# Patient Record
Sex: Male | Born: 1935 | Race: White | Hispanic: No | Marital: Married | Smoking: Former smoker
Health system: Southern US, Community
[De-identification: ages and names within clinical notes are randomized; demographics above are authoritative.]

## PROBLEM LIST (undated history)

## (undated) DIAGNOSIS — R0902 Hypoxemia: Secondary | ICD-10-CM

## (undated) DIAGNOSIS — R269 Unspecified abnormalities of gait and mobility: Secondary | ICD-10-CM

## (undated) DIAGNOSIS — R351 Nocturia: Secondary | ICD-10-CM

## (undated) DIAGNOSIS — H0230 Blepharochalasis unspecified eye, unspecified eyelid: Secondary | ICD-10-CM

## (undated) DIAGNOSIS — I10 Essential (primary) hypertension: Secondary | ICD-10-CM

## (undated) DIAGNOSIS — Z9181 History of falling: Secondary | ICD-10-CM

## (undated) DIAGNOSIS — R0602 Shortness of breath: Secondary | ICD-10-CM

## (undated) DIAGNOSIS — D485 Neoplasm of uncertain behavior of skin: Secondary | ICD-10-CM

## (undated) DIAGNOSIS — M2550 Pain in unspecified joint: Secondary | ICD-10-CM

## (undated) DIAGNOSIS — M47816 Spondylosis without myelopathy or radiculopathy, lumbar region: Secondary | ICD-10-CM

## (undated) DIAGNOSIS — M2560 Stiffness of unspecified joint, not elsewhere classified: Secondary | ICD-10-CM

## (undated) DIAGNOSIS — R7989 Other specified abnormal findings of blood chemistry: Secondary | ICD-10-CM

## (undated) DIAGNOSIS — N400 Enlarged prostate without lower urinary tract symptoms: Secondary | ICD-10-CM

## (undated) DIAGNOSIS — L821 Other seborrheic keratosis: Secondary | ICD-10-CM

## (undated) DIAGNOSIS — R32 Unspecified urinary incontinence: Secondary | ICD-10-CM

## (undated) DIAGNOSIS — D469 Myelodysplastic syndrome, unspecified: Secondary | ICD-10-CM

## (undated) DIAGNOSIS — D649 Anemia, unspecified: Secondary | ICD-10-CM

## (undated) DIAGNOSIS — E039 Hypothyroidism, unspecified: Secondary | ICD-10-CM

## (undated) DIAGNOSIS — Z8719 Personal history of other diseases of the digestive system: Secondary | ICD-10-CM

## (undated) DIAGNOSIS — D510 Vitamin B12 deficiency anemia due to intrinsic factor deficiency: Secondary | ICD-10-CM

## (undated) DIAGNOSIS — M1990 Unspecified osteoarthritis, unspecified site: Secondary | ICD-10-CM

## (undated) DIAGNOSIS — M25569 Pain in unspecified knee: Secondary | ICD-10-CM

## (undated) DIAGNOSIS — C44201 Unspecified malignant neoplasm of skin of unspecified ear and external auricular canal: Secondary | ICD-10-CM

## (undated) DIAGNOSIS — R5383 Other fatigue: Secondary | ICD-10-CM

## (undated) DIAGNOSIS — R5381 Other malaise: Secondary | ICD-10-CM

## (undated) DIAGNOSIS — R39198 Other difficulties with micturition: Secondary | ICD-10-CM

## (undated) DIAGNOSIS — Z9889 Other specified postprocedural states: Secondary | ICD-10-CM

## (undated) DIAGNOSIS — R1310 Dysphagia, unspecified: Secondary | ICD-10-CM

## (undated) HISTORY — DX: Spondylosis without myelopathy or radiculopathy, lumbar region: M47.816

## (undated) HISTORY — DX: Nocturia: R35.1

## (undated) HISTORY — DX: Shortness of breath: R06.02

## (undated) HISTORY — DX: Other malaise: R53.81

## (undated) HISTORY — DX: Benign prostatic hyperplasia without lower urinary tract symptoms: N40.0

## (undated) HISTORY — DX: Personal history of other diseases of the digestive system: Z87.19

## (undated) HISTORY — DX: Pain in unspecified joint: M25.50

## (undated) HISTORY — DX: Other fatigue: R53.83

## (undated) HISTORY — DX: History of falling: Z91.81

## (undated) HISTORY — DX: Vitamin B12 deficiency anemia due to intrinsic factor deficiency: D51.0

## (undated) HISTORY — DX: Dysphagia, unspecified: R13.10

## (undated) HISTORY — DX: Anemia, unspecified: D64.9

## (undated) HISTORY — DX: Neoplasm of uncertain behavior of skin: D48.5

## (undated) HISTORY — DX: Essential (primary) hypertension: I10

## (undated) HISTORY — DX: Unspecified urinary incontinence: R32

## (undated) HISTORY — DX: Other seborrheic keratosis: L82.1

## (undated) HISTORY — PX: TONSILLECTOMY: SUR1361

## (undated) HISTORY — DX: Hypothyroidism, unspecified: E03.9

## (undated) HISTORY — DX: Other difficulties with micturition: R39.198

## (undated) HISTORY — DX: Hypoxemia: R09.02

## (undated) HISTORY — DX: Pain in unspecified knee: M25.569

## (undated) HISTORY — DX: Stiffness of unspecified joint, not elsewhere classified: M25.60

## (undated) HISTORY — PX: CHOLECYSTECTOMY: SHX55

## (undated) HISTORY — DX: Unspecified osteoarthritis, unspecified site: M19.90

## (undated) HISTORY — DX: Myelodysplastic syndrome, unspecified: D46.9

## (undated) HISTORY — DX: Other specified postprocedural states: Z98.890

## (undated) HISTORY — DX: Unspecified malignant neoplasm of skin of unspecified ear and external auricular canal: C44.201

## (undated) HISTORY — DX: Blepharochalasis unspecified eye, unspecified eyelid: H02.30

## (undated) HISTORY — DX: Unspecified abnormalities of gait and mobility: R26.9

## (undated) HISTORY — DX: Other specified abnormal findings of blood chemistry: R79.89

---

## 1978-11-21 HISTORY — PX: REPAIR ZENKER'S DIVERTICULA: SUR1212

## 2006-10-25 ENCOUNTER — Encounter (HOSPITAL_COMMUNITY): Admission: RE | Admit: 2006-10-25 | Discharge: 2006-12-19 | Payer: Self-pay | Admitting: Internal Medicine

## 2007-02-02 ENCOUNTER — Encounter (HOSPITAL_COMMUNITY): Admission: RE | Admit: 2007-02-02 | Discharge: 2007-03-22 | Payer: Self-pay | Admitting: Internal Medicine

## 2007-10-24 ENCOUNTER — Ambulatory Visit: Payer: Self-pay | Admitting: Gastroenterology

## 2007-10-24 DIAGNOSIS — K5900 Constipation, unspecified: Secondary | ICD-10-CM | POA: Insufficient documentation

## 2007-10-24 DIAGNOSIS — K644 Residual hemorrhoidal skin tags: Secondary | ICD-10-CM | POA: Insufficient documentation

## 2007-10-25 ENCOUNTER — Ambulatory Visit: Payer: Self-pay | Admitting: Gastroenterology

## 2007-10-25 LAB — CONVERTED CEMR LAB
Basophils Relative: 0.1 % (ref 0.0–3.0)
Eosinophils Absolute: 0 10*3/uL (ref 0.0–0.7)
Eosinophils Relative: 0.7 % (ref 0.0–5.0)
HCT: 27.4 % — ABNORMAL LOW (ref 39.0–52.0)
Hemoglobin: 9.2 g/dL — ABNORMAL LOW (ref 13.0–17.0)
MCV: 100.8 fL — ABNORMAL HIGH (ref 78.0–100.0)
Monocytes Absolute: 0.1 10*3/uL (ref 0.1–1.0)
Monocytes Relative: 3.2 % (ref 3.0–12.0)
Neutro Abs: 1.4 10*3/uL (ref 1.4–7.7)
Platelets: 92 10*3/uL — ABNORMAL LOW (ref 150–400)
WBC: 4.2 10*3/uL — ABNORMAL LOW (ref 4.5–10.5)

## 2007-10-26 ENCOUNTER — Encounter: Payer: Self-pay | Admitting: Gastroenterology

## 2007-10-26 LAB — CONVERTED CEMR LAB
AST: 37 units/L (ref 0–37)
Albumin: 3.3 g/dL — ABNORMAL LOW (ref 3.5–5.2)
Alkaline Phosphatase: 81 units/L (ref 39–117)
BUN: 21 mg/dL (ref 6–23)
Basophils Absolute: 0 10*3/uL (ref 0.0–0.1)
Basophils Relative: 0 % (ref 0.0–3.0)
Creatinine, Ser: 1.2 mg/dL (ref 0.4–1.5)
GFR calc Af Amer: 73 mL/min
GFR calc non Af Amer: 60 mL/min
Glucose, Bld: 115 mg/dL — ABNORMAL HIGH (ref 70–99)
HCT: 27.7 % — ABNORMAL LOW (ref 39.0–52.0)
Hemoglobin: 9 g/dL — ABNORMAL LOW (ref 13.0–17.0)
Iron: 201 ug/dL — ABNORMAL HIGH (ref 42–165)
MCHC: 32.7 g/dL (ref 30.0–36.0)
MCV: 101.6 fL — ABNORMAL HIGH (ref 78.0–100.0)
Monocytes Absolute: 0.1 10*3/uL (ref 0.1–1.0)
Neutro Abs: 1 10*3/uL — ABNORMAL LOW (ref 1.4–7.7)
RBC: 2.72 M/uL — ABNORMAL LOW (ref 4.22–5.81)
RDW: 20.5 % — ABNORMAL HIGH (ref 11.5–14.6)
Saturation Ratios: 87.5 % — ABNORMAL HIGH (ref 20.0–50.0)
Total Protein: 7.5 g/dL (ref 6.0–8.3)
Vitamin B-12: 676 pg/mL (ref 211–911)
aPTT: 29.2 s (ref 21.7–29.8)

## 2007-10-27 ENCOUNTER — Ambulatory Visit (HOSPITAL_COMMUNITY): Admission: RE | Admit: 2007-10-27 | Discharge: 2007-10-27 | Payer: Self-pay | Admitting: Gastroenterology

## 2007-11-14 ENCOUNTER — Ambulatory Visit: Payer: Self-pay | Admitting: Gastroenterology

## 2009-02-28 ENCOUNTER — Telehealth: Payer: Self-pay | Admitting: Gastroenterology

## 2010-05-19 ENCOUNTER — Other Ambulatory Visit (HOSPITAL_COMMUNITY): Payer: Self-pay | Admitting: Internal Medicine

## 2010-05-26 ENCOUNTER — Other Ambulatory Visit (HOSPITAL_COMMUNITY): Payer: Self-pay | Admitting: *Deleted

## 2010-05-27 ENCOUNTER — Other Ambulatory Visit (HOSPITAL_COMMUNITY): Payer: Self-pay

## 2010-05-27 ENCOUNTER — Encounter (HOSPITAL_COMMUNITY): Payer: Self-pay

## 2010-05-30 ENCOUNTER — Emergency Department (HOSPITAL_COMMUNITY): Payer: Medicare Other

## 2010-05-30 ENCOUNTER — Inpatient Hospital Stay (HOSPITAL_COMMUNITY)
Admission: EM | Admit: 2010-05-30 | Discharge: 2010-06-05 | DRG: 377 | Disposition: A | Discharge status: Home / Self Care | Payer: Medicare Other | Attending: Internal Medicine | Admitting: Internal Medicine

## 2010-05-30 DIAGNOSIS — E039 Hypothyroidism, unspecified: Secondary | ICD-10-CM | POA: Diagnosis present

## 2010-05-30 DIAGNOSIS — D62 Acute posthemorrhagic anemia: Secondary | ICD-10-CM | POA: Diagnosis present

## 2010-05-30 DIAGNOSIS — D638 Anemia in other chronic diseases classified elsewhere: Secondary | ICD-10-CM | POA: Diagnosis present

## 2010-05-30 DIAGNOSIS — I509 Heart failure, unspecified: Secondary | ICD-10-CM | POA: Diagnosis present

## 2010-05-30 DIAGNOSIS — N179 Acute kidney failure, unspecified: Secondary | ICD-10-CM | POA: Diagnosis present

## 2010-05-30 DIAGNOSIS — N400 Enlarged prostate without lower urinary tract symptoms: Secondary | ICD-10-CM | POA: Diagnosis present

## 2010-05-30 DIAGNOSIS — Z66 Do not resuscitate: Secondary | ICD-10-CM | POA: Diagnosis present

## 2010-05-30 DIAGNOSIS — K922 Gastrointestinal hemorrhage, unspecified: Principal | ICD-10-CM | POA: Diagnosis present

## 2010-05-30 DIAGNOSIS — D61818 Other pancytopenia: Secondary | ICD-10-CM | POA: Diagnosis present

## 2010-05-30 DIAGNOSIS — I5031 Acute diastolic (congestive) heart failure: Secondary | ICD-10-CM | POA: Diagnosis present

## 2010-05-30 DIAGNOSIS — I498 Other specified cardiac arrhythmias: Secondary | ICD-10-CM | POA: Diagnosis present

## 2010-05-30 DIAGNOSIS — I214 Non-ST elevation (NSTEMI) myocardial infarction: Secondary | ICD-10-CM | POA: Diagnosis not present

## 2010-05-30 LAB — DIFFERENTIAL
Basophils Relative: 0 % (ref 0–1)
Eosinophils Absolute: 0 10*3/uL (ref 0.0–0.7)
Eosinophils Relative: 0 % (ref 0–5)
Lymphocytes Relative: 25 % (ref 12–46)
Neutro Abs: 2.1 10*3/uL (ref 1.7–7.7)
Neutrophils Relative %: 72 % (ref 43–77)

## 2010-05-30 LAB — BRAIN NATRIURETIC PEPTIDE: Pro B Natriuretic peptide (BNP): 1435 pg/mL — ABNORMAL HIGH (ref 0.0–100.0)

## 2010-05-30 LAB — POCT CARDIAC MARKERS: Troponin i, poc: 0.58 ng/mL (ref 0.00–0.09)

## 2010-05-30 LAB — CBC
Hemoglobin: 5.5 g/dL — CL (ref 13.0–17.0)
MCH: 30.6 pg (ref 26.0–34.0)
MCHC: 31.8 g/dL (ref 30.0–36.0)
MCV: 96.1 fL (ref 78.0–100.0)
RBC: 1.8 MIL/uL — ABNORMAL LOW (ref 4.22–5.81)

## 2010-05-30 LAB — BASIC METABOLIC PANEL
Chloride: 107 mEq/L (ref 96–112)
GFR calc non Af Amer: 52 mL/min — ABNORMAL LOW (ref >60)
Glucose, Bld: 159 mg/dL — ABNORMAL HIGH (ref 70–99)
Potassium: 4.4 mEq/L (ref 3.5–5.1)
Sodium: 134 mEq/L — ABNORMAL LOW (ref 135–145)

## 2010-05-30 LAB — OCCULT BLOOD, POC DEVICE: Fecal Occult Bld: POSITIVE

## 2010-05-31 ENCOUNTER — Emergency Department (HOSPITAL_COMMUNITY): Payer: Medicare Other

## 2010-05-31 ENCOUNTER — Inpatient Hospital Stay (HOSPITAL_COMMUNITY): Payer: Medicare Other

## 2010-05-31 DIAGNOSIS — K921 Melena: Secondary | ICD-10-CM

## 2010-05-31 DIAGNOSIS — D62 Acute posthemorrhagic anemia: Secondary | ICD-10-CM

## 2010-05-31 LAB — FOLATE: Folate: 4.1 ng/mL

## 2010-05-31 LAB — CBC
Hemoglobin: 8.2 g/dL — ABNORMAL LOW (ref 13.0–17.0)
MCHC: 31.5 g/dL (ref 30.0–36.0)
MCHC: 33.2 g/dL (ref 30.0–36.0)
Platelets: 88 10*3/uL — ABNORMAL LOW (ref 150–400)
RDW: 21.7 % — ABNORMAL HIGH (ref 11.5–15.5)
RDW: 20.4 % — ABNORMAL HIGH (ref 11.5–15.5)

## 2010-05-31 LAB — COMPREHENSIVE METABOLIC PANEL
ALT: 16 U/L (ref 0–53)
AST: 27 U/L (ref 0–37)
Calcium: 8.6 mg/dL (ref 8.4–10.5)
GFR calc Af Amer: 60 mL/min — ABNORMAL LOW (ref >60)
Sodium: 136 mEq/L (ref 135–145)
Total Protein: 6 g/dL (ref 6.0–8.3)

## 2010-05-31 LAB — URINALYSIS, ROUTINE W REFLEX MICROSCOPIC
Leukocytes, UA: NEGATIVE
Nitrite: NEGATIVE
Specific Gravity, Urine: 1.015 (ref 1.005–1.030)
pH: 5 (ref 5.0–8.0)

## 2010-05-31 LAB — MRSA PCR SCREENING: MRSA by PCR: NEGATIVE

## 2010-05-31 LAB — CARDIAC PANEL(CRET KIN+CKTOT+MB+TROPI)
CK, MB: 17.1 ng/mL (ref 0.3–4.0)
Relative Index: INVALID (ref 0.0–2.5)
Relative Index: INVALID (ref 0.0–2.5)
Total CK: 73 U/L (ref 7–232)
Total CK: 92 U/L (ref 7–232)
Troponin I: 2.41 ng/mL (ref 0.00–0.06)
Troponin I: 4.54 ng/mL (ref 0.00–0.06)

## 2010-05-31 LAB — BASIC METABOLIC PANEL
Calcium: 7.8 mg/dL — ABNORMAL LOW (ref 8.4–10.5)
GFR calc Af Amer: 47 mL/min — ABNORMAL LOW (ref >60)
GFR calc non Af Amer: 39 mL/min — ABNORMAL LOW (ref >60)
Sodium: 135 mEq/L (ref 135–145)

## 2010-05-31 LAB — IRON AND TIBC: Saturation Ratios: 15 % — ABNORMAL LOW (ref 20–55)

## 2010-05-31 LAB — URINE MICROSCOPIC-ADD ON

## 2010-05-31 LAB — FERRITIN: Ferritin: 394 ng/mL — ABNORMAL HIGH (ref 22–322)

## 2010-05-31 LAB — ABO/RH: ABO/RH(D): O POS

## 2010-06-01 ENCOUNTER — Encounter: Payer: Self-pay | Admitting: Internal Medicine

## 2010-06-01 ENCOUNTER — Inpatient Hospital Stay (HOSPITAL_COMMUNITY): Payer: Medicare Other

## 2010-06-01 DIAGNOSIS — K921 Melena: Secondary | ICD-10-CM

## 2010-06-01 DIAGNOSIS — I509 Heart failure, unspecified: Secondary | ICD-10-CM

## 2010-06-01 DIAGNOSIS — R609 Edema, unspecified: Secondary | ICD-10-CM

## 2010-06-01 DIAGNOSIS — D649 Anemia, unspecified: Secondary | ICD-10-CM

## 2010-06-01 LAB — CROSSMATCH
ABO/RH(D): O POS
Antibody Screen: NEGATIVE
Unit division: 0

## 2010-06-01 LAB — URINE CULTURE
Colony Count: NO GROWTH
Culture  Setup Time: 201203111134

## 2010-06-01 LAB — HEMOGLOBIN A1C: Mean Plasma Glucose: 111 mg/dL (ref <117)

## 2010-06-01 LAB — CBC
MCH: 30.7 pg (ref 26.0–34.0)
MCHC: 33.3 g/dL (ref 30.0–36.0)
Platelets: 61 10*3/uL — ABNORMAL LOW (ref 150–400)

## 2010-06-01 LAB — BASIC METABOLIC PANEL
Calcium: 7.5 mg/dL — ABNORMAL LOW (ref 8.4–10.5)
Creatinine, Ser: 1.7 mg/dL — ABNORMAL HIGH (ref 0.4–1.5)
GFR calc Af Amer: 46 mL/min — ABNORMAL LOW (ref >60)

## 2010-06-01 LAB — CARDIAC PANEL(CRET KIN+CKTOT+MB+TROPI)
Total CK: 190 U/L (ref 7–232)
Troponin I: 4.14 ng/mL (ref 0.00–0.06)

## 2010-06-01 NOTE — H&P (Signed)
NAMEDONDRELL, LOUDERMILK NO.:  [id number]  MEDICAL RECORD NO.:  [id number]           PATIENT TYPE:  LOCATION:                                 FACILITY:  PHYSICIAN:  Jeoffrey Massed, MD    DATE OF BIRTH:  September 06, 1914  DATE OF ADMISSION: DATE OF DISCHARGE:                             HISTORY & PHYSICAL   PRIMARY CARE PRACTITIONER:  Lenon Curt. Chilton Si, MD  CHIEF COMPLAINT:  Shortness of breath.  HISTORY OF PRESENT ILLNESS:  The patient is a 77 year old white male who lives in an independent living facility with a past medical history of external hemorrhoids, prior history of anemia, and claims to have had prior PRBC transfusion 2 times, hypothyroidism, who comes in with shortness of breath.  Per the patient, the shortness of breath has at least been going on for 6 months if not year or more at least.  The shortness of breath is exclusively on exertion.  There is no associated swelling of his legs, there is no associated paroxysmal nocturnal dyspnea or orthopnea.  The patient does have a dry cough all the time. Apparently 1 or 2 weeks ago, the patient had frank rectal bleeding with fresh blood for 2-3 days which he attributes it to his hemorrhoids and he never sought medical attention.  He is not sure that if he continues to have some amount of bleeding daily because he says it is hard to distinguish between stool and blood at times.  He has no fever.  He has no chills.  He has no chest pain.  Over the past 2-3 days, his shortness of breath has increased to the extent that he is not even able to walk across his living room and therefore he was brought to the ED for further evaluation and treatment.  ALLERGIES:  None.  PAST MEDICAL HISTORY: 1. External hemorrhoids. 2. Hypothyroidism. 3. BPH. 4. Prior history of anemia and apparently has been transfused at least     2 times in the past. 5. History of Zenker diverticulum and status post repair.  PAST SURGICAL  HISTORY: 1. Cholecystectomy. 2. Surgery to repair the Zenker diverticulum.  Medications at home include: 1. Aleve on a p.r.n. basis. 2. Aspirin. 3. Finasteride at an unknown dose. 4. Levothyroxine at an unknown dose.  FAMILY HISTORY:  Noncontributory to the current problems.  SOCIAL HISTORY:  Lives with his wife at an independent living facility and denies any toxic habits.  PHYSICAL EXAMINATION:  VITAL SIGNS:  Temperature of 98.3, heart rate of 103, blood pressure of 116/49, respiration of 24. GENERAL:  Elderly white male lying in bed, does not appear to be in any distress; however, looks very pale, able to lie flat without any problems. HEENT: Atraumatic, normocephalic.  Pupils equally react to light and accommodation. NECK:  Supple. CHEST:  There is rales heard in all lung zones.  Some of the rales are inspiratory.  There is good air entry, however, in bilateral lungs. CARDIOVASCULAR:  Heart sounds are slightly tachycardic and irregular. ABDOMEN:  Soft, nontender, nondistended; however, there is a vague but firm mass in his  right mid to lower abdominal region that is dull on percussion and nontender.  The mass is very smooth.  Unable to describe further.  It does seem to extend down to his pelvis. EXTREMITIES:  No edema. NEUROLOGIC:  The patient is alert and oriented x3 and does not have any neurological deficits.  LABORATORY DATA:  CBC shows a WBC of 3.0, hemoglobin of 5.5, hematocrit of 17.3, and a platelet count of 91,000.  BNP is 1435.  Fecal occult blood is positive.  First set of troponins is 0.58.  Chemistry shows sodium of 134, potassium of 4.4, chloride of 107, bicarb of 22, glucose of 159, BUN of 31, creatinine of 1.29, and a calcium of 8.5.  RADIOLOGICAL STUDIES:  Portable chest x-ray shows bilateral airspace disease superimposed on interstitial lung disease.  Differential includes pulmonary edema or infection.  ASSESSMENT: 1. Exertional shortness of breath,  multifactorial, but most likely     predominantly secondary to anemia with probably a component of     congestive heart failure as the BNP is high and the patient does     have bilateral infiltrates on his chest x-ray.  Unable at this     point to rule out an infectious etiology as well.  On examination,     he has inspiratory rales, and at this point unable to rule out     underlying chronic interstitial lung disease as well. 2. Anemia along with pancytopenia.  The patient does claim to have had     blood transfusions in the past.  His anemia probably has worsened     secondary to a possible acute blood loss component that the patient     has had a few weeks ago when he had a fresh blood per rectum for 2-     3 days that mostly resolved spontaneously.  Given all three lines     suppressed, this perhaps is a bone marrow issue.  We will need to     do a thorough anemia workup including vitamin B12 levels.  He does     have questionable but palpable vague mass in his right mid lower     abdomen and the issue of malignancy will also need to be     entertained. 3. Positive troponins along with ST-segment depression in the     inferolateral leads.  This perhaps is all secondary to edema and     ischemia given his profound anemia.  Given his age, it is possible     that he does have underlying native coronary artery disease as     well. 4. Bilateral pulmonary infiltrates on his chest x-ray along with a     longstanding history of exertional dyspnea and increased BNP.  This     is perhaps all congestive heart failure, rule out high output     cardiac failure.  However, he does have inspiratory rails on exam     and the possibility of underlying interstitial lung disease cannot     be ruled out. 5. He does have a mass that is very vague on palpation, but their in     his right lower abdominal area it is uncertain whether this     represents a bladder, but at this point in time we will go  ahead     and order an abdominal ultrasound and place a Foley to see if the     mass actually disappears with catheterization and that would  confirm it being bladder rather than anything else. 6. History of hypothyroidism.  PLAN: 1. The patient will be admitted to a step-down unit. 2. We will at least transfusion him 3 units very slowly and give 20 mg     of Lasix in between the 3 units.  Please note that the ER physician     has already given him 40 mg of Lasix before any transfusion has     started. 3. We will empirically start him on Avelox and also get a 2-D echo. 4. Given the fact that he is tachycardic and has some ST-segment     depression in the inferolateral leads, we will start him on a beta-     blocker to decrease the heart rate.  We will also put him on     simvastatin.  Aspirin obviously will be contraindicated at this     time in time given a possible GI bleed.  Please note that a rectal     examination done by the ED physician showed frank blood on his     finger.  It is also felt that at this point in time that his     cardiac issue is possibly secondary to demand ischemia and     transfusing him will perhaps be sufficient at this point in time.     Given his advanced age and his desire not to have aggressive therapy, I doubt this patient will benefit from a left heart     catheterization as it would not change management.  Perhaps once     the bleeding issue has stabilized, putting him on a low-dose     aspirin would be sufficient. 5. Further plan will depend as the patient's clinical course evolves. 6. The patient did have a flexible sigmoidoscopy done in 2009 by     [facility] which per e-chart only showed external hemorrhoids.  If     his bleeding continues, then this may be a possible viable option     as well. 7. We will also put him on a proton pump inhibitor. 8. Code status.  The patient is a do not resuscitate/no code blue.  Total time spent equals 65  minutes.     Jeoffrey Massed, MD     SG/MEDQ  D:  05/31/2010  T:  05/31/2010  Job:  [id number]  cc:   Lenon Curt. Chilton Si, M.D.  Electronically Signed by Jeoffrey Massed  on 06/01/2010 03:27:19 PM

## 2010-06-02 ENCOUNTER — Ambulatory Visit (HOSPITAL_COMMUNITY): Payer: Medicare Other | Attending: Internal Medicine

## 2010-06-02 DIAGNOSIS — I059 Rheumatic mitral valve disease, unspecified: Secondary | ICD-10-CM

## 2010-06-02 DIAGNOSIS — I214 Non-ST elevation (NSTEMI) myocardial infarction: Secondary | ICD-10-CM

## 2010-06-02 LAB — DIFFERENTIAL
Basophils Relative: 0 % (ref 0–1)
Eosinophils Absolute: 0.1 10*3/uL (ref 0.0–0.7)
Eosinophils Relative: 2 % (ref 0–5)
Monocytes Relative: 5 % (ref 3–12)
Neutrophils Relative %: 56 % (ref 43–77)

## 2010-06-02 LAB — CBC
MCH: 30.2 pg (ref 26.0–34.0)
Platelets: 51 10*3/uL — ABNORMAL LOW (ref 150–400)
RBC: 2.58 MIL/uL — ABNORMAL LOW (ref 4.22–5.81)
RDW: 19.5 % — ABNORMAL HIGH (ref 11.5–15.5)

## 2010-06-02 LAB — COMPREHENSIVE METABOLIC PANEL
AST: 29 U/L (ref 0–37)
Albumin: 2.4 g/dL — ABNORMAL LOW (ref 3.5–5.2)
BUN: 30 mg/dL — ABNORMAL HIGH (ref 6–23)
Calcium: 7.5 mg/dL — ABNORMAL LOW (ref 8.4–10.5)
Chloride: 106 mEq/L (ref 96–112)
Creatinine, Ser: 1.32 mg/dL (ref 0.4–1.5)
GFR calc Af Amer: >60 mL/min (ref >60)
Total Protein: 5.5 g/dL — ABNORMAL LOW (ref 6.0–8.3)

## 2010-06-03 DIAGNOSIS — D61818 Other pancytopenia: Secondary | ICD-10-CM

## 2010-06-03 LAB — CBC
HCT: 28.8 % — ABNORMAL LOW (ref 39.0–52.0)
Hemoglobin: 9.3 g/dL — ABNORMAL LOW (ref 13.0–17.0)
MCH: 29.5 pg (ref 26.0–34.0)
MCHC: 32.3 g/dL (ref 30.0–36.0)
MCV: 91.4 fL (ref 78.0–100.0)
RBC: 3.15 MIL/uL — ABNORMAL LOW (ref 4.22–5.81)

## 2010-06-03 LAB — BASIC METABOLIC PANEL
CO2: 29 mEq/L (ref 19–32)
Calcium: 8 mg/dL — ABNORMAL LOW (ref 8.4–10.5)
Chloride: 104 mEq/L (ref 96–112)
Creatinine, Ser: 1.51 mg/dL — ABNORMAL HIGH (ref 0.4–1.5)
Glucose, Bld: 127 mg/dL — ABNORMAL HIGH (ref 70–99)

## 2010-06-03 LAB — SAVE SMEAR

## 2010-06-04 ENCOUNTER — Inpatient Hospital Stay (HOSPITAL_COMMUNITY): Payer: Medicare Other

## 2010-06-04 ENCOUNTER — Other Ambulatory Visit: Payer: Self-pay | Admitting: Oncology

## 2010-06-04 LAB — BASIC METABOLIC PANEL
CO2: 27 mEq/L (ref 19–32)
Calcium: 8.3 mg/dL — ABNORMAL LOW (ref 8.4–10.5)
Chloride: 104 mEq/L (ref 96–112)
Creatinine, Ser: 1.41 mg/dL (ref 0.4–1.5)
GFR calc Af Amer: 57 mL/min — ABNORMAL LOW (ref >60)
Sodium: 134 mEq/L — ABNORMAL LOW (ref 135–145)

## 2010-06-04 LAB — CBC
HCT: 30.1 % — ABNORMAL LOW (ref 39.0–52.0)
Hemoglobin: 9.5 g/dL — ABNORMAL LOW (ref 13.0–17.0)
MCHC: 31.6 g/dL (ref 30.0–36.0)
RDW: 18.3 % — ABNORMAL HIGH (ref 11.5–15.5)
WBC: 3.5 10*3/uL — ABNORMAL LOW (ref 4.0–10.5)

## 2010-06-04 LAB — LACTATE DEHYDROGENASE: LDH: 267 U/L — ABNORMAL HIGH (ref 94–250)

## 2010-06-04 LAB — URIC ACID: Uric Acid, Serum: 8.9 mg/dL — ABNORMAL HIGH (ref 4.0–7.8)

## 2010-06-04 NOTE — Consult Note (Signed)
NAMEFINIS, HENDRICKSEN NO.:  [id number]  MEDICAL RECORD NO.:  [id number]           PATIENT TYPE:  I  LOCATION:  2922                         FACILITY:  [facility]  PHYSICIAN:  Rollene Rotunda, MD, FACCDATE OF BIRTH:  1914-10-31  DATE OF CONSULTATION:  06/01/2010 DATE OF DISCHARGE:                                CONSULTATION   PRIMARY CARE PHYSICIAN:  Lenon Curt. Chilton Si, MD  REASON FOR CONSULTATION:  Evaluate patient with edema on chest x-ray (congestive heart failure and non-Q-wave myocardial infarction).  HISTORY OF PRESENT ILLNESS:  The patient is a very pleasant 77 year old gentleman without a past cardiac history other than questionable heart failure some years ago but he does not report a reduced ejection fraction.  He did see a cardiologist several years ago but has not had a cardiac catheterization or a stress test.  He lives at [facility] and gets around with a walker.  He has been slowing down over the years but has had no dramatic decline until approximately 6-8 months ago when he started having some increasing dyspnea with exertion.  He finally presented to the emergency room at his wife's insistence when he was short of breath with minimal activity.  He was not however describing PND or orthopnea.  He would occasionally notice his heart racing but no presyncope or syncope.  He was not having any chest pressure, neck or arm discomfort.  In the emergency room, he was found to have a hemoglobin of 5 with otherwise pancytopenia.  There was some edema on chest x-ray and an elevated BNP.  Cardiac markers have been elevated as described below.  However, EKG has not demonstrated acute ST-segment changes.  He has been treated with 3 units of packed red blood cells. He has had some low-dose Lasix.  He has had an EGD which demonstrated no etiology for his anemia.  We are asked to consult because of the heart failure and elevated enzymes.  PAST MEDICAL  HISTORY: 1. Hemorrhoids (he did report bright red blood per rectum in the days     prior to admission). 2. Hypothyroidism. 3. Benign prostatic hypertrophy. 4. Anemia in the past. 5. Zenker diverticulum.  PAST SURGICAL HISTORY: 1. Cholecystectomy. 2. Zenker repair.  ALLERGIES/INTOLERANCES:  None.  MEDICATIONS:  (Prior to admission) 1. Aleve. 2. Tucks. 3. Acetaminophen. 4. Citrucel. 5. Doxazosin. 6. Finasteride 5 mg daily. 7. Levothyroxine 75 mcg daily. 8. Aspirin.  SOCIAL HISTORY:  He will be married 70 years in April.  He is retired. He lives at [facility].  He has no children.  FAMILY HISTORY:  Noncontributory for early coronary artery disease though his father probably died of a myocardial infarction at age 53.  REVIEW OF SYSTEMS:  As stated in the HPI, negative for all other systems.  PHYSICAL EXAMINATION:  GENERAL:  The patient is pleasant and in no distress. VITAL SIGNS:  Blood pressure 107/47, heart rate 95 and regular, respiratory rate 14, 99% saturation on room air. HEENT:  Eyelids unremarkable, pupils equal, round, react to light, fundi not visualized, oral mucosa unremarkable. NECK:  No jugular distention at  45 degrees.  Carotid upstroke brisk and symmetrical.  No bruits, no thyromegaly. LYMPHATICS:  No cervical, axillary, inguinal adenopathy. LUNGS:  Bilateral basilar crackles one-third up. BACK:  No costovertebral angle tenderness. CHEST:  Unremarkable. HEART:  PMI not displaced or sustained, S1 and S2 within normal.  No S3, S4, no clicks, no rubs, no murmurs. ABDOMEN:  Flat, positive bowel sounds, normal in frequency and pitch, no bruits, no rebound, no guarding, no midline pulsatile mass, no hepatomegaly, no splenomegaly. SKIN:  No rashes, no nodules. EXTREMITIES:  Upper pulses 2+, diminished dorsalis pedis and posterior tibialis bilaterally, 2+ popliteals and femorals bilaterally, trace lower extremity edema. NEURO:  Oriented to person, place,  time,. Cranial nerves II-XII grossly intact, motor grossly intact.  LABORATORY DATA:  WBC 3.1, hemoglobin 8.0, platelets 61, sodium 137, potassium 3.8, BUN 34, creatinine 1.7, creatinine clearance 26.66, CK-MB peak 190, total MB 13, troponin 2.41, 3.53, 4.54, 4.14.  BNP 14, 35.  Chest x-ray, bilateral edema on chronic interstitial lung disease.  EKG; sinus rhythm, rate 69, premature atrial contractions, poor anterior R-wave progression, leftward axis, no acute ST-T wave changes, QTC prolonged.  Admitting EKG demonstrated some lateral ST-segment depression with sinus tachycardia.  ASSESSMENT AND PLAN: 1. Non-Q-wave myocardial infarction.  The patient did have some     transient EKG changes.  His enzymes were elevated.  However, this     is all very likely in response to his very low hemoglobin.  He has     had no chest pain.  He does have pulmonary edema and congestive     heart failure as described below.  However, at this point I would     continue medical management.  I agree with not over-diuresing him     as his BUN and creatinine are rising.  I would be very gentle with     this and I would wait for the echocardiogram to further determine.     He will likely need some further gentle IV diuresis and perhaps     p.o. diuresis.  However, I would not suggest invasive evaluation     with his advanced age and renal insufficiency.  He and I have     discussed this and he agrees and wants conservative management. 2. Anemia per the primary team.  This may be a combination of     underproduction and blood loss. 3. Congestive heart failure.  He will get an echocardiogram.  Further     decisions on medical management will be pending these results.  He     is on a low-dose of beta-blocker now, but I would avoid an ACE     inhibitor with his rising creatinine.     Rollene Rotunda, MD, [facility]     JH/MEDQ  D:  06/01/2010  T:  06/01/2010  Job:  [id number]  cc:   Lenon Curt. Chilton Si,  M.D.  Electronically Signed by Rollene Rotunda MD [facility] on 06/04/2010 08:04:29 PM

## 2010-06-05 LAB — CBC
HCT: 26.1 % — ABNORMAL LOW (ref 39.0–52.0)
MCV: 90.9 fL (ref 78.0–100.0)
Platelets: 46 10*3/uL — ABNORMAL LOW (ref 150–400)
RBC: 2.87 MIL/uL — ABNORMAL LOW (ref 4.22–5.81)
WBC: 2.7 10*3/uL — ABNORMAL LOW (ref 4.0–10.5)

## 2010-06-05 LAB — DIFFERENTIAL
Basophils Absolute: 0 10*3/uL (ref 0.0–0.1)
Lymphocytes Relative: 35 % (ref 12–46)
Lymphs Abs: 0.9 10*3/uL (ref 0.7–4.0)
Neutrophils Relative %: 57 % (ref 43–77)

## 2010-06-05 LAB — BASIC METABOLIC PANEL
BUN: 46 mg/dL — ABNORMAL HIGH (ref 6–23)
Chloride: 107 mEq/L (ref 96–112)
GFR calc non Af Amer: 45 mL/min — ABNORMAL LOW (ref >60)
Glucose, Bld: 101 mg/dL — ABNORMAL HIGH (ref 70–99)
Potassium: 5 mEq/L (ref 3.5–5.1)
Sodium: 139 mEq/L (ref 135–145)

## 2010-06-05 LAB — ERYTHROPOIETIN: Erythropoietin: 79 m[IU]/mL — ABNORMAL HIGH (ref 2.6–34.0)

## 2010-06-05 NOTE — Op Note (Signed)
  Dwayne Stuart, AMEND NO.:  [id number]  MEDICAL RECORD NO.:  [id number]           PATIENT TYPE:  I  LOCATION:  2014                         FACILITY:  [facility]  PHYSICIAN:  Genene Churn. Cyndie Chime, M.D., F.A.C.P.     DATE OF BIRTH:  [birth year]  DATE OF PROCEDURE:  06/04/2010 DATE OF DISCHARGE:                              OPERATIVE REPORT   SURGEON:  Genene Churn. Cyndie Chime, MD, FACP  PROCEDURE NOTE:  Left posterior iliac crest bone marrow aspiration and biopsy performed with 2% local lidocaine anesthesia without complication.  Red rule procedures followed.  The patient ASA (performance status) 0.  INDICATIONS:  Further evaluation of pancytopenia.     Genene Churn. Cyndie Chime, M.D., F.A.C.P.     JMG/MEDQ  D:  06/04/2010  T:  06/05/2010  Job:  [id number]  cc:   Ramiro Harvest, MD Lenon Curt. Chilton Si, M.D. Redge Gainer. Perini, M.D. Rollene Rotunda, MD, [facility]  Electronically Signed by Cephas Darby M.D. on 06/05/2010 06:47:11 PM

## 2010-06-08 ENCOUNTER — Other Ambulatory Visit (HOSPITAL_COMMUNITY): Payer: Self-pay | Admitting: Internal Medicine

## 2010-06-08 NOTE — Consult Note (Signed)
Dwayne Stuart, CIANCI NO.:  [id number]  MEDICAL RECORD NO.:  [id number]           PATIENT TYPE:  I  LOCATION:  2014                         FACILITY:  [facility]  PHYSICIAN:  Genene Churn. Cyndie Chime, M.D., F.A.C.P.     DATE OF BIRTH:  [birth year]  DATE OF CONSULTATION:  06/03/2010 DATE OF DISCHARGE:                                CONSULTATION   REQUESTING PHYSICIAN:  [facility].  HISTORY OF PRESENT ILLNESS:  [name] is a very pleasant 77 year old white male who was overall in good health, but with known history of anemia for which he has required intermittent blood transfusions for about 3 years. The patient does have history of known external hemorrhoids with intermittent bleeding as well as B12 deficiency, for which he receives injections monthly.  On May 30, 2010, he presented with progressive dyspnea on exertion and bright red blood per rectum 1 week prior to admission, not resolving for about 2-3 days. He states that bleeding was low grade and consistent with previous episodes when his hemorrhoids have flared in the past.   Admission hemoglobin was 5.5 with a hematocrit of 17.5.  He received 3 units of packed RBCs, with some improvement in his counts rising to 8 and 24 respectively.  He was evaluated  by [facility], with upper endoscopy on June 01, 2010, negative for lesions or active bleeding.  A flexible sigmoidoscopy in 2009 was negative except for the presence of hemorrhoids.  At this time, he was not felt to be a candidate for colonoscopy due to the risks involved at his age and the fact that he likely sustained a NSTEMI at time of this admission.   Despite the transfusions,the counts continue to be low, today at 9.3 and  28.8 respectively, withan MCV of 91.4.  Labs show elevated ferritin of 394.  His folic acid is borderline.  Peripheral blood smear reviewed by Dr. Cyndie Chime, shows hypogranular neutrophils, with a dimorphic population of red  cells.  Of note, he is also noticed to have low white count, on admission at 3.0 with ANC of 2.1 and today with a white count of 2.9, ANC of 1.5. and Platelets are low as well.  On admission, they were 91,000 and have fallen to current value of43,000.    We were asked to see the patient in consultation in view of pancytopenia with predominant anemia.  PAST MEDICAL HISTORY: 1. Hypothyroidism. 2. History of external hemorrhoids for at least 20 years. 3. History of anemia, transfusion dependent for about 5 years,     according to him never been able to raise the hemoglobin above 9. 4. B12 deficiency, with injections monthly. 5. BPH, without urinary obstruction. 6. History of Zenker diverticulum with repair in the 1980s. 7. No code blue. 8. Osteoarthritis, worse in the lower extremities. 9. Known right rib fracture. 10.Hard of hearing. 11.The patient had some exposure to photochemicals and carbon     tetrachloride as well as mercury.  This admission, the patient also has: 1. Acute heart failure, with echo pending. 2. Acute renal insufficiency. 3. NSTEMI. 4. Left hydronephrosis on  ultrasound.  PAST SURGICAL HISTORY: 1. Status post cholecystectomy in 1987. 2. Status post Zenker repair in 1980s, Dr. Lennice Sites. 3. Status post tonsillectomy in 1923. 4. Status post bilateral cataract surgery. 5. Status post fracture repair of the right knee in 1943. 6. Skin lesions removal.  ALLERGIES: 1. CODEINE. 2. MORPHINE SULFATE. 3. CONTRAST MEDIA.  MEDICATIONS: 1. MiraLax 17 grams b.i.d. 2. Coreg 6.25 mg b.i.d. 3. D3 1000 units p.o. daily. 4. Cardura 2 mg daily. 5. Pepcid 20 mg daily. 6. Proscar 5 mg daily. 7. Zocor 10 mg daily. 8. Ventolin nebulizer q.3 h. p.r.n. 9. Tylenol 650 mg q.4 h. p.r.n. 10.Norco 1-2 tablets q.4 h. p.r.n. 11.Zofran 4 mg IV - p.o. q.6 h. p.r.n. 12.Phenergan 6.25 mg IV - p.o. q.6 h. p.r.n. 13.Benadryl 25 mg p.o. - IV q.8 h. p.r.n. 14.Synthroid 75 mcg  daily.  REVIEW OF SYSTEMS:  The patient states that his anemia began after blood donation in [place] [employer] in the 1980s, requiring several years of transfusion dependence(close to 5 years), never been able to rise his hemoglobin greater than 9 or 10.  The last lab workup was performed by Dr. Chilton Si at his office in the end of 2011.  No fever, chills, or night sweats.  Occasional headaches.  No mental status changes or vision changes.  He has some dysphagia, secondary to mastication issues, which is being analyzed while in the hospital.  He had dyspnea on exertion but no shortness of breath at rest.  He has chronic nonproductive cough.  No chest pain or palpitations.  No abdominal pain.  No appetite changes. He did have weight loss of 21 pounds from the 171-150 pounds in the last 2 months.  He also has increasing fatigue.  No nausea, vomiting, or diarrhea.  He has intermittent constipation, according to him he does not have any minor withdrawal.  He had blood in the stools 1-2 weeks prior to admission, lasting 2-3 days, without resolution after applying hemorrhoidal creams.  The blood was bright red.  He denies any blood in the urine microscopically.  No gum or nose bleeds.  No hemoptysis.  No quinine products.  No ice chips.  No significant amount of caffeine.  As mentioned above, the patient uses Aleve as an outpatient with aspirin, due to pain in his knees.  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  Also, as a young adult, he was exposed to mercury. Denies diabetes, although states that at times he has elevated blood sugars.  He also has nocturia 2-4 times at night.  Rest of the review of systems is negative.  FAMILY HISTORY:  Mother died of old age.  Father died at 23 with possible MI.  He has one sister who is alive in [place] and one brother who died with complications postoperatively after  cholecystectomy.  SOCIAL HISTORY:  The patient is married for 70 years to wife Dwayne Stuart.  No children.  Main contact is his nephew, telephone number [phone], Solon Augusta, lives in [place].  No code blue.  Never used tobacco.  No alcohol or recreational drugs.  The patient is retired, lives at [employer].  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  HEALTH MAINTENANCE:  He had a flexible sigmoidoscopy in 2009 by Dr. Christella Hartigan.  Last flu shot and Pneumovax were in 2011.  Never had a colonoscopy.  Never had any upper  endoscopy until June 01, 2010 which is now with negative results.  PHYSICAL EXAMINATION:  GENERAL:  This is a well-developed, well nourished 77 year old white male in no acute distress, who looks appropriate for his age.  Alert and oriented x3. VITAL SIGNS:  Blood pressure 120/59, pulse 79, respirations 20, temperature 97.6, O2 sats 97% on 2 L, and weight 67.5. HEENT:  Normocephalic and atraumatic.  Sclerae anicteric.  Oral cavity without thrush or lesions.  The patient had lower dentures. NECK:  Supple.  No cervical or supraclavicular masses. LUNGS:  With a trace of bibasilar rales but no wheezing or rhonchi.  No axillary masses. CARDIOVASCULAR:  Regular rate and rhythm without murmurs, rubs, or gallops.  Distant sounds. ABDOMEN:  Protuberant, nontender, bowel sounds x4.  No hepatosplenomegaly. GU/RECTAL:  Deferred. EXTREMITIES:  No clubbing or cyanosis.  No edema.  No inguinal masses. SKIN:  Very pale, but no lesions or bruising present.  No petechial rash. NEURO:  Nonfocal.  LABORATORY DATA:  Hemoglobin 9.3, hematocrit 28.8, white count 2.9, platelets 443, MCV 91.4, ANC 1.5 for a white count of 2.6 on June 02, 2010, lymphocytes of 1.0, monocytes 0.1, and retic count is 63.8.  BNP 671.  Blood type is a O positive, with antibody screen negative.  Sodium 137, potassium 4.3, BUN 37, creatinine 1.51, and glucose 127.   Total bilirubin 0.8, alkaline phosphatase is 73, AST 29, ALT 33, total protein 5.5, albumin 2.4, and calcium 8.0.  Hemoccult on admission was positive, 3-4+ on May 30, 2010.  Initial urinalysis shows moderate blood on May 31, 2010 but negative for blood later.  His urinalysis shows negative for protein, negative for leukocytes or bilirubin.  No nitrites.  MRSA screen negative.  TSH 3.806.  Troponin was 3.53 on May 31, 2010 from 0.58 on admission.  A1c is 5.5.  EKG on admission on May 30, 2010 was showing ST depression, aVF in V4-V6 leads.  ASSESSMENT AND PLAN:  Dr. Cyndie Chime has seen and evaluated the patient.  This is a very pleasant 77 year old white male in overall good health with transfusion-dependent anemia for about 5 years.  The patient has mentioned that in the past it was hard to get hemoglobin above 9.  The patient presents with severe normochromic anemia, leukopenia, and thrombocytopenia.  The labs show elevated ferritin at 394.  Borderline folic acid.  The peripheral blood smear shows dimorphic population of rbc's, microcytes, and macrocytes, 3+ anisopoikilocytosis, 1+ elliptocytes.  Platelets are less than 3-5 per high-power field. Neutrophils are hypogranular.  No blasts.  He has a history of exposure to photochemicals.   These findings are all compatible with a myelodysplastic syndrome.  IMPRESSION AND PLAN:  Dr. Cyndie Chime had a long discussion with the patient, encouraged him to consider having a bone marrow biopsy for diagnosis since we may be able to offer him some relatively simple treatments  to stimulate blood production including erythropoitin stimulating agents and/or lenalidomide,  an oral agent with marrow stimulation properties.  The patient agreed with the procedure, which  is to be performed on June 04, 2010.   Depending on results of chromosome analysis on the marrow, up to 50% of patients can respond to lenalidomide (Revlimid).   Serum  erythropoietin level is currently pending.  If the Epo level is less than 300, he would be a candidate for an ESA such as Procrit or Aransep.    Old records from Dr. Loraine Leriche Perini's office, would be very helpful in documenting his anemia history.  Thank you very much for allowing [place] the opportunity to participate in the care of Mr. Koob.     Marlowe Kays, P.A.   ______________________________ Genene Churn. Cyndie Chime, M.D., F.A.C.P.    SW/MEDQ  D:  06/04/2010  T:  06/05/2010  Job:  [id number]  cc:   Lenon Curt. Chilton Si, M.D. Rollene Rotunda, MD, [facility]  Electronically Signed by Marlowe Kays P.A. on 06/07/2010 07:34:25 PM Electronically Signed by Cephas Darby M.D. on 06/08/2010 09:15:39 AM

## 2010-06-09 ENCOUNTER — Other Ambulatory Visit (HOSPITAL_COMMUNITY): Payer: Self-pay

## 2010-06-09 ENCOUNTER — Ambulatory Visit (HOSPITAL_COMMUNITY): Payer: Medicare Other

## 2010-06-09 ENCOUNTER — Ambulatory Visit (HOSPITAL_COMMUNITY): Admission: RE | Admit: 2010-06-09 | Payer: Medicare Other | Source: Ambulatory Visit

## 2010-06-09 NOTE — Procedures (Signed)
Summary: Upper Endoscopy  Patient: [name] Note: All result statuses are Final unless otherwise noted.  Tests: (1) Upper Endoscopy (EGD)   EGD Upper Endoscopy       DONE     [name]     [address]     [place], [place]  [address]          ENDOSCOPY PROCEDURE REPORT          PATIENT:  Dwayne, Stuart  MR#:  [id number]     BIRTHDATE:  1915-01-04, 89 yrs. old  GENDER:  male          ENDOSCOPIST:  Hedwig Morton. Juanda Chance, MD     Referred by:  Quita Skye Allred, M.D.          PROCEDURE DATE:  06/01/2010     PROCEDURE:  EGD, diagnostic 43235     ASA CLASS:  Class III     INDICATIONS:  anemia, melena Hgb 5.5, pancytopenia, hx of ext.     hems, never had a colonoscopy     s/p remote Zenker's diverticulectomy          MEDICATIONS:   Versed 2 mg, Fentanyl 25 mcg     TOPICAL ANESTHETIC:  Cetacaine Spray          DESCRIPTION OF PROCEDURE:   After the risks benefits and     alternatives of the procedure were thoroughly explained, informed     consent was obtained.  The Pentax Gastroscope [id number] endoscope     was introduced through the mouth and advanced to the second     portion of the duodenum, without limitations.  The instrument was     slowly withdrawn as the mucosa was fully examined.     <<PROCEDUREIMAGES>>          The upper, middle, and distal third of the esophagus were     carefully inspected and no abnormalities were noted. The z-line     was well seen at the GEJ. The endoscope was pushed into the fundus     which was normal including a retroflexed view. The antrum,gastric     body, first and second part of the duodenum were unremarkable (see     image001, image002, image003, image005, and image006).     Retroflexed views revealed no abnormalities.    The scope was then     withdrawn from the patient and the procedure completed.          COMPLICATIONS:  None          ENDOSCOPIC IMPRESSION:     1) Normal EGD     nothing to account for melena  RECOMMENDATIONS:     Miralax, follow H/H, consider CT scan, too risky at his age for     colonoscopy          REPEAT EXAM:  In 0 year(s) for.          ______________________________     Hedwig Morton. Juanda Chance, MD          CC:          n.     eSIGNED:   Hedwig Morton. Karlye Ihrig at 06/01/2010 11:53 AM          [name], [id number]  Note: An exclamation mark (!) indicates a result that was not dispersed into the flowsheet. Document Creation Date: 06/01/2010 11:54 AM _______________________________________________________________________  (1) Order result status: Final Collection or observation date-time: 06/01/2010 11:41 Requested date-time:  Receipt date-time:  Reported date-time:  Referring Physician:   Ordering Physician: Lina Sar [phone]) Specimen Source:  Source: Launa Grill Order Number: [id number] Lab site:

## 2010-06-10 ENCOUNTER — Encounter: Payer: Medicare Other | Admitting: Oncology

## 2010-06-11 DIAGNOSIS — D469 Myelodysplastic syndrome, unspecified: Secondary | ICD-10-CM

## 2010-06-11 HISTORY — DX: Myelodysplastic syndrome, unspecified: D46.9

## 2010-06-12 NOTE — Discharge Summary (Signed)
[name], [name] NO.:  [id number]  MEDICAL RECORD NO.:  [id number]           PATIENT TYPE:  LOCATION:                                 FACILITY:  PHYSICIAN:  Ramiro Harvest, MD    DATE OF BIRTH:  Mar 06, 1915  DATE OF ADMISSION:  05/31/2010 DATE OF DISCHARGE:  06/05/2010                              DISCHARGE SUMMARY   PRIMARY CARE PHYSICIAN:  Lenon Curt. Chilton Si, MD  DISCHARGE DIAGNOSES: 1. Gastrointestinal bleed, unknown etiology, resolved. 2. Anemia secondary to gastrointestinal bleed and anemia of chronic     disease and pancytopenia, status post 3 units packed red blood     cells. 3. Diastolic heart failure, compensated. 4. Non-ST elevated myocardial infarction secondary to demand ischemia. 5. Pancytopenia. 6. Hypothyroidism. 7. History of external hemorrhoids. 8. History of benign prostatic hypertrophy. 9. History of Zenker diverticulum status post repair. 10.Status post cholecystostomy. 11.History of external hemorrhoids.  DISCHARGE MEDICATIONS: 1. Finasteride 5 mg p.o. daily. 2. Doxazosin 2 mg p.o. daily. 3. Acetaminophen 1000 mg p.o. daily p.r.n. 4. Proctosol-HC cream 2.5% apply twice daily. 5. Coreg 6.25 mg p.o. b.i.d. 6. Simvastatin 10 mg p.o. daily. 7. Pepcid 20 mg p.o. daily. 8. Aleve 220 mg p.o. q.8 h p.r.n. 9. Levothyroxine 75 mcg p.o. daily. 10.Citrucel powder 1 teaspoon daily. 11.Tucks 50% topical as needed. 12.Vitamin D 1000 units p.o. daily. 13.Vitamin B12 of 1000 mcg IM q. monthly.  DISPOSITION AND FOLLOWUP:  The patient will be discharged back to his assisted living facility.  The patient will need to follow up with Dr. Cyndie Chime for results of his bone-marrow biopsy on June 24, 2010, and further evaluation and recommendations.  The patient is to call to schedule a followup appointment with his cardiologist, Dr. Antoine Poche in 1- week.  The patient is also to follow up with his PCP, Dr. Murray Hodgkins, in 1-week and on follow  up with his PCP, CBC will need to be checked to follow up on the patient's H and H.  A BMET will also need to be checked to follow up on his electrolytes and renal function.  The patient will discuss with PCP as to whether they need to go ahead and get a CT of the abdomen and pelvis to follow up on the hydronephrosis noted during this hospitalization on the abdominal ultrasound to rule out kidney stones.  CONSULTATIONS DONE: 1. A GI consultation was done.  The patient was seen in consultation     by Dr. Juanda Chance on May 31, 2010. 2. A Cardiology consultation was done.  The patient was seen in     consultation by Dr. Antoine Poche of [facility], June 01, 2010. 3. A Hematology/Oncology consultation was done.  The patient was seen     in consultation by Dr. Cyndie Chime on June 03, 2010. 4. A Palliative Care consultation was done.  The patient was seen in     consultation by Dr. Sharl Ma of Palliative Care Team on June 05, 2010.  PROCEDURES PERFORMED:  The patient had a chest x-ray done, May 30, 2010 that showed bilateral airspace disease, superimposed interstitial lung  disease.  Differential includes pulmonary edema or infection. Abdominal ultrasound obtained May 31, 2010 showed mild left hydronephrosis with indeterminate echogenic structure in the left kidney, echogenic structure could represent a stone.  In addition, there is left kidney cortical thinning, left hydronephrosis could be further evaluated with a CT urogram.  Normal appearance of the right kidney with a small cyst.  A chest x-ray was done on June 01, 2010 that showed stable improved aeration suggesting decreased edema, stable cardiomegaly, persistent interstitial airspace disease, edema or resolving infection still not excluded, right posterior rib fractures without pneumothorax.  Chest x-ray done June 02, 2010 showed stable ventilation, bilateral pulmonary opacity may all relate to chronic lung disease, but  superimposed infection is not excluded.  A modified barium swallow was done on June 04, 2010.  A upper endoscopy was done on June 01, 2010, per Dr. Lina Sar that showed a normal EGD, nothing to account for melena.  A 2-D echo was done on June 02, 2010 that showed a normal left ventricular cavity size, EF 55-60%, probable hypokinesis of the inferior lateral myocardium.  Doppler parameters are consistent with abnormal left ventricular relaxation, mild mitral valvular regurgitation, left atrium is mildly dilated.  A bone marrow biopsy was done by Dr. Cephas Darby on June 04, 2010.  BRIEF ADMISSION HISTORY AND PHYSICAL:  Dwayne Stuart is a pleasant 77- year-old Caucasian gentleman, who lives in independent living facility with past medical history of external hemorrhoids prior history of anemia, claims to have had prior RBCs transfusion x2, hypothyroidism, who presented with shortness of breath.  Per patient, shortness of breath had been ongoing for at least 6 months if not a year or more. Shortness of breath is exclusively on exertion.  There is no associated swelling of the legs.  No associated paroxysmal nocturnal dyspnea or orthopnea.  The patient does have a dry cough at all times.  Apparently, 1-2 weeks prior to admission, the patient had frank rectal bleeding, fresh blood for the past 2-3 days, which he attributes to his hemorrhoids, but never sort any medical attention.  The patient is not sure if he continued to have some amount of bleeding daily because he states it was hard for him to distinguish between the stool and the blood at times.  The patient had no fever, had no chills, no chest pain. Over the past 2-3 days, shortness of breath had increased to the extent that he was not even able to walk across his living room and was brought to the ED for further evaluation and treatment.  For the rest of admission history and physical, please see H and P dictated per  Dr. Jerral Ralph of job [phone].  HOSPITAL COURSE: 1. GI bleed.  The patient was admitted with a probable GI bleed, which     was felt to be part of the reason for his shortness of breath.  The     patient was typed and crossed and transfused 3 units packed red     blood cells and given some Lasix in between.  His hemoglobin on     admission was 5.1.  FOBT which was done was positive.  Cardiac     enzymes were cycled as well as a 2-D echo was obtained.  A GI     consultation was done.  The patient was seen in consultation by Dr.     Lina Sar.  The patient was placed on a PPI.  His aspirin was  discontinued.  He had an upper endoscopy, which was done, results     as stated above, which was negative for any source of bleeding.     The patient did not have any further bleeding.  It was felt that     the patient was too frail to undergo a colonoscopy and was     recommended that a CT of the abdomen and pelvis could be done.     However, the patient did not want any further invasive procedures     or tests done during this time.  As such it was not done.  The     patient was followed.  His hemoglobin came up appropriately.  The     patient's hemoglobin remained stable.  He will be discharged home     on Pepcid and will follow up with his PCP as an outpatient.  On     followup, the patient's CBC will need to be checked to follow up on     his H and H and may transfuse as needed.  Goal per Cardiology was     to keep his hemoglobin in the 9-10 range.  The patient will be     discharged in stable and improved condition.\ 2. Non-ST elevated MI.  During the hospitalization, cardiac enzymes     were cycled secondary to the patient's shortness of breath.  The     patient did have an elevated troponins.  A 2-D echo was done with     results as stated above, which did show an EF of 55-60%, mild LVH.     There was probable hypokinesis of the inferior lateral myocardium.     A Cardiology consultation  was obtained.  The patient was seen in     consultation by Dr. Antoine Poche on June 01, 2010 and at that time,     the patient did not want much aggressive treatment.  The patient     was diuresed with IV Lasix as well as he was also in acute on     chronic diastolic heart failure.  He was placed on a beta-blocker     as well as a statin.  It was felt that due to the patient's     advanced age, no invasive evaluation was needed and to treat him     conservatively.  Patient remained asymptomatic throughout the     hospitalization and will be discharged home on twice daily Coreg,     which was adjusted per Cardiology as well as statin.  Lasix was     discontinued secondary to an increasing renal function and the     patient been euvolemic.  The patient will be discharged in stable     and improved condition to follow up with his cardiologist as an     outpatient. 3. Anemia.  Patient was noted to be anemic.  Anemia panel was     obtained, which was consistent with anemia of chronic disease.  It     was felt patient's anemia was likely multifactorial in nature     secondary to GI bleed as well as anemia of chronic disease.     Patient did have a pancytopenia and as such a Hematology     consultation was obtained.  It was felt the patient's pancytopenia     was likely secondary to a myelodysplastic syndrome.  Patient was     transfused a total of 3 units of packed  red blood cells.  During     his hospitalization, his hemoglobin came up from 5.1 and remained     in the high 8s to the 9s.  Patient did not have any further     bleeding.  Patient had a bone marrow biopsy done per Dr.     Cyndie Chime and he will follow up as an outpatient.  Epogen levels     were also obtained during this hospitalization and on the day of     discharge came back at 79.  As this is less than 300, we will defer     to Hematology, but the patient may benefit from Aranesp or Epogen     injections.  This will be deferred  to Hematology.  Patient will     follow up with them as an outpatient. 4. Diastolic heart failure.  On admission, the patient had presented     with shortness of breath.  2-D echo was obtained.  Cardiac enzymes,     which were cycled were elevated consistent with a non-ST elevated     MI, which was felt to be secondary to a demand ischemia.  A BNP     obtained on the day of admission was elevated at 1435.  The patient     was diuresed gently with IV fluids with monitoring his renal     function as well.  His BNP improved.  The patient improved     clinically.  He was subsequently transitioned to oral Lasix and per     Cardiology, Lasix was subsequently discontinued as the patient was     euvolemic and there was a rise in his renal function.  The     patient's BNP improved and was down to 671.  Patient on the day of     discharge was euvolemic and that was compensated in terms of his     heart failure. 5. Hypothyroidism, remained stable throughout the hospitalization.     Patient was maintained on his home dose of Synthroid.  TSH, which     was obtained came back within normal limits at 3.806.  The patient     will follow up with his PCP as an outpatient.  During the     hospitalization secondary to the patient wanting minimal procedures     done, did not want any invasive procedures done as well, a     Palliative care consultation was obtained.  The patient was seen in     consultation by Dr. Sharl Ma on June 05, 2010.  The patient's goal has     been a DNR/DNI.  No artificial nutrition.  The patient does not     want any artificial feedings or any PEG tubes.  The patient does     not want any further workup in the hospital.  Did not want a CT     scan done to follow up on the left hydronephrosis.  The patient     will follow up with his PCP to discuss the CT scan and also follow     up with Dr. Cyndie Chime as an outpatient for further evaluation of     his pancytopenia and his bone marrow  biopsy results.  The rest of     the patient's chronic medical issues remained stable throughout the     hospitalization and patient will be discharged in stable and     improved condition.  On the day of discharge,  vital signs     temperature 98.1, blood pressure 116/59, pulse of 69, respirations     20, sating 95% on room air.  DISCHARGE LABS:  Sodium 139, potassium 5, chloride 107, bicarb 29, BUN 46, creatinine 1.45, glucose of 101, calcium of 8.5.  CBC with a white count of 2.7, hemoglobin 8.4, hematocrit of 26.1, and a platelet count of 46.  It was a pleasure taking care of [name].     Ramiro Harvest, MD     DT/MEDQ  D:  06/05/2010  T:  06/05/2010  Job:  [id number]  cc:   Lenon Curt. Chilton Si, M.D. Rollene Rotunda, MD, [facility] Hedwig Morton. Juanda Chance, MD Genene Churn. Cyndie Chime, M.D., F.A.C.P. Mauro Kaufmann, MD  Electronically Signed by Ramiro Harvest MD on 06/12/2010 12:13:18 PM

## 2010-06-15 ENCOUNTER — Telehealth: Payer: Self-pay | Admitting: Gastroenterology

## 2010-06-15 NOTE — Telephone Encounter (Signed)
The pt was concerned with his appt time being late in the day and wanted an earlier time.  I advised that we did not have any available times until 07/28/10.  He wants to keep the appt as it is and he will call back if he doesn't feel as if he can make it.

## 2010-06-15 NOTE — Telephone Encounter (Signed)
Pt aware no available time slots any earlier he will keep scheduled time

## 2010-06-16 NOTE — Consult Note (Signed)
  NAME:  NEAL, TRULSON NO.:  [id number]  MEDICAL RECORD NO.:  [id number]           PATIENT TYPE:  I  LOCATION:  2014                         FACILITY:  [facility]  PHYSICIAN:  Mauro Kaufmann, MD         DATE OF BIRTH:  [birth year]  DATE OF CONSULTATION: DATE OF DISCHARGE:                                CONSULTATION   ADDENDUM: Please make a note that total time spent during this consultation is 65 minutes and more than 50% of time was spent in counseling and coordination of care.     Mauro Kaufmann, MD     GL/MEDQ  D:  06/05/2010  T:  06/05/2010  Job:  [id number]  Electronically Signed by Mauro Kaufmann  on 06/16/2010 09:49:51 AM

## 2010-06-16 NOTE — Consult Note (Signed)
  Dwayne Stuart, Dwayne Stuart NO.:  [id number]  MEDICAL RECORD NO.:  [id number]           PATIENT TYPE:  I  LOCATION:  2014                         FACILITY:  [facility]  PHYSICIAN:  Mauro Kaufmann, MD         DATE OF BIRTH:  05/01/1914  DATE OF CONSULTATION: DATE OF DISCHARGE:                                CONSULTATION   REQUESTING PHYSICIAN:  Dr. Janee Morn  REASON FOR CONSULTATION:  Goals of care.  HISTORY OF PRESENT ILLNESS:  A 77 year old male who was admitted with shortness of breath and cough going on for the past 6 months, found to be severely anemic status post blood transfusion and also had a bone marrow biopsy to rule out myelodysplastic syndrome.  The patient also had guaiac-positive stools, underwent upper GI endoscopy, which did not show any significant lesions.  The echocardiogram showed grade 1 diastolic dysfunction, which could have contributed to the patient's symptoms.  Abdominal ultrasound done to rule out any abdominal mass showed left hydronephrosis.  There was question of possible mass on palpation in the right lower abdominal area for which colonoscopy was not done because of high risk due to the age.  The patient says that he would like to discuss further testing with his primary care physician as outpatient.  The patient is very functional at home and would like to go home after discharge.  PAST MEDICAL HISTORY: 1. Significant for external hemorrhoids. 2. Hypothyroidism. 3. BPH. 4. History of anemia. 5. History of Zenker diverticulum.  ASSESSMENT AND RECOMMENDATIONS: 1. The patient is DNR/DNI.  No artificial nutrition.  I discussed with     the patient the modified barium swallow results and also esophageal     dysphagia.  At this time, the patient does not want any artificial     nutrition or PEG tube and he also says that he would not like to     get these in future if he develops severe dysphagia in future. 2. No further workup in the  hospital.  The patient says that he does     not want any CAT scan to find out these various abnormalities     including the left hydronephrosis and questionable colon mass.  He     says he will discuss it with his primary care doctor, Dr. Murray Hodgkins whether to get a CAT scan or not. 3. Followup with Dr. Cyndie Chime for anemia and bone marrow biopsy     results as outpatient.  DISPOSITION:  Home.  FOLLOWUP:  The patient will follow with the PCP as outpatient.  Time spent is 65 min, More than 50% TIME SPENT IN counselling and coordination of care.     Mauro Kaufmann, MD     GL/MEDQ  D:  06/05/2010  T:  06/05/2010  Job:  [id number]  Electronically Signed by Mauro Kaufmann  on 06/16/2010 09:49:36 AM

## 2010-06-19 ENCOUNTER — Ambulatory Visit: Payer: Self-pay | Admitting: Gastroenterology

## 2010-06-26 ENCOUNTER — Other Ambulatory Visit: Payer: Self-pay | Admitting: Oncology

## 2010-06-26 ENCOUNTER — Encounter (HOSPITAL_BASED_OUTPATIENT_CLINIC_OR_DEPARTMENT_OTHER): Payer: Medicare Other | Admitting: Oncology

## 2010-06-26 DIAGNOSIS — D696 Thrombocytopenia, unspecified: Secondary | ICD-10-CM

## 2010-06-26 DIAGNOSIS — D649 Anemia, unspecified: Secondary | ICD-10-CM

## 2010-06-26 DIAGNOSIS — D469 Myelodysplastic syndrome, unspecified: Secondary | ICD-10-CM

## 2010-06-26 LAB — CBC & DIFF AND RETIC
Basophils Absolute: 0 10*3/uL (ref 0.0–0.1)
EOS%: 1.5 % (ref 0.0–7.0)
Eosinophils Absolute: 0 10*3/uL (ref 0.0–0.5)
HCT: 25.4 % — ABNORMAL LOW (ref 38.4–49.9)
HGB: 8.2 g/dL — ABNORMAL LOW (ref 13.0–17.1)
Immature Retic Fract: 7.2 % (ref 0.00–13.40)
MCH: 30.6 pg (ref 27.2–33.4)
NEUT#: 1.1 10*3/uL — ABNORMAL LOW (ref 1.5–6.5)
NEUT%: 53 % (ref 39.0–75.0)
RDW: 21.4 % — ABNORMAL HIGH (ref 11.0–14.6)
Retic Ct Abs: 50.92 10*3/uL (ref 24.10–77.50)
lymph#: 0.8 10*3/uL — ABNORMAL LOW (ref 0.9–3.3)

## 2010-06-26 LAB — HOLD TUBE, BLOOD BANK

## 2010-06-26 LAB — TECHNOLOGIST REVIEW

## 2010-07-13 ENCOUNTER — Other Ambulatory Visit: Payer: Self-pay | Admitting: Oncology

## 2010-07-13 ENCOUNTER — Encounter (HOSPITAL_BASED_OUTPATIENT_CLINIC_OR_DEPARTMENT_OTHER): Payer: Medicare Other | Admitting: Oncology

## 2010-07-13 DIAGNOSIS — D649 Anemia, unspecified: Secondary | ICD-10-CM

## 2010-07-13 DIAGNOSIS — D469 Myelodysplastic syndrome, unspecified: Secondary | ICD-10-CM

## 2010-07-13 LAB — CBC WITH DIFFERENTIAL/PLATELET
BASO%: 0.4 % (ref 0.0–2.0)
Eosinophils Absolute: 0 10*3/uL (ref 0.0–0.5)
LYMPH%: 39.2 % (ref 14.0–49.0)
MCHC: 31.4 g/dL — ABNORMAL LOW (ref 32.0–36.0)
MONO#: 0.1 10*3/uL (ref 0.1–0.9)
NEUT#: 1.6 10*3/uL (ref 1.5–6.5)
RBC: 2.92 10*6/uL — ABNORMAL LOW (ref 4.20–5.82)
RDW: 20.6 % — ABNORMAL HIGH (ref 11.0–14.6)
WBC: 2.8 10*3/uL — ABNORMAL LOW (ref 4.0–10.3)
lymph#: 1.1 10*3/uL (ref 0.9–3.3)
nRBC: 0 % (ref 0–0)

## 2010-07-27 ENCOUNTER — Encounter (HOSPITAL_BASED_OUTPATIENT_CLINIC_OR_DEPARTMENT_OTHER): Payer: Medicare Other | Admitting: Oncology

## 2010-07-27 ENCOUNTER — Other Ambulatory Visit: Payer: Self-pay | Admitting: Oncology

## 2010-07-27 DIAGNOSIS — D469 Myelodysplastic syndrome, unspecified: Secondary | ICD-10-CM

## 2010-07-27 DIAGNOSIS — D649 Anemia, unspecified: Secondary | ICD-10-CM

## 2010-07-27 LAB — CBC WITH DIFFERENTIAL/PLATELET
BASO%: 0 % (ref 0.0–2.0)
Basophils Absolute: 0 10*3/uL (ref 0.0–0.1)
EOS%: 0.9 % (ref 0.0–7.0)
HCT: 28.2 % — ABNORMAL LOW (ref 38.4–49.9)
HGB: 8.9 g/dL — ABNORMAL LOW (ref 13.0–17.1)
MCH: 30.1 pg (ref 27.2–33.4)
MONO#: 0.1 10*3/uL (ref 0.1–0.9)
NEUT#: 1.1 10*3/uL — ABNORMAL LOW (ref 1.5–6.5)
RDW: 20.5 % — ABNORMAL HIGH (ref 11.0–14.6)
WBC: 2.3 10*3/uL — ABNORMAL LOW (ref 4.0–10.3)
lymph#: 1.1 10*3/uL (ref 0.9–3.3)

## 2010-07-29 ENCOUNTER — Ambulatory Visit (INDEPENDENT_AMBULATORY_CARE_PROVIDER_SITE_OTHER): Payer: Medicare Other | Admitting: Gastroenterology

## 2010-07-29 ENCOUNTER — Encounter: Payer: Self-pay | Admitting: Gastroenterology

## 2010-07-29 VITALS — BP 146/68 | HR 88 | Ht 65.5 in | Wt 157.0 lb

## 2010-07-29 DIAGNOSIS — R1310 Dysphagia, unspecified: Secondary | ICD-10-CM

## 2010-07-29 NOTE — Progress Notes (Signed)
Review of pertinent gastrointestinal problems: 1. history of repaired Zenker's diverticulum remotely 2. EGD in March 2012 by Dr. Juanda Chance while patient hospitalized with melena, pancytopenia. EGD was normal. Patient eventually found to have myelodysplastic syndrome by bone marrow biopsy   HPI: This is a very pleasant 77 year old man whom I last saw about 3 years ago.  Bothered by "too much phlegm in throat."  It doesn't really bother him, but he does feel something in his throat.  He does not have sensation of dysphagia.  He was referred for these symptoms 2-3 months ago and in the interim he was hospitalized for gi bleeding.  Underwent EGD and MBSS (see results above)  Was diagnosed with myelodysplasia based on BM biopsy, pan.       Physical Exam: BP 146/68  Pulse 88  Ht 5' 5.5" (1.664 m)  Wt 157 lb (71.215 kg)  BMI 25.73 kg/m2 Constitutional: Frail, elderly man who walks with a walker Psychiatric: alert and oriented x3 Abdomen: soft, nontender, nondistended, no obvious ascites, no peritoneal signs, normal bowel sounds    Assessment and plan: 77 y.o. male with   Eat slowly, chew your food well.  Would not proceed with any intvasive testing for minor feeling of phlegm in throat especially since  The EGD ws normal, and a modified barium swallow study suggested nonspecific esophageal dysmotility. The feeling of phlegm in his throat is mild, he believes that his nothing that he can't deal with.

## 2010-07-29 NOTE — Patient Instructions (Signed)
Chew your food well. Eat slowly. A copy of this information will be made available to Dr. Carles Collet.

## 2010-08-10 ENCOUNTER — Encounter (HOSPITAL_BASED_OUTPATIENT_CLINIC_OR_DEPARTMENT_OTHER): Payer: Medicare Other | Admitting: Oncology

## 2010-08-10 ENCOUNTER — Other Ambulatory Visit: Payer: Self-pay | Admitting: Oncology

## 2010-08-10 DIAGNOSIS — D469 Myelodysplastic syndrome, unspecified: Secondary | ICD-10-CM

## 2010-08-10 LAB — CBC WITH DIFFERENTIAL/PLATELET
Basophils Absolute: 0 10*3/uL (ref 0.0–0.1)
Eosinophils Absolute: 0 10*3/uL (ref 0.0–0.5)
HGB: 9.8 g/dL — ABNORMAL LOW (ref 13.0–17.1)
MONO#: 0.1 10*3/uL (ref 0.1–0.9)
NEUT#: 1.1 10*3/uL — ABNORMAL LOW (ref 1.5–6.5)
Platelets: 48 10*3/uL — ABNORMAL LOW (ref 140–400)
RBC: 3.09 10*6/uL — ABNORMAL LOW (ref 4.20–5.82)
RDW: 22.1 % — ABNORMAL HIGH (ref 11.0–14.6)
WBC: 2.4 10*3/uL — ABNORMAL LOW (ref 4.0–10.3)

## 2010-08-24 ENCOUNTER — Encounter (HOSPITAL_BASED_OUTPATIENT_CLINIC_OR_DEPARTMENT_OTHER): Payer: Medicare Other | Admitting: Oncology

## 2010-08-24 ENCOUNTER — Other Ambulatory Visit: Payer: Self-pay | Admitting: Oncology

## 2010-08-24 DIAGNOSIS — D469 Myelodysplastic syndrome, unspecified: Secondary | ICD-10-CM

## 2010-08-24 LAB — CBC WITH DIFFERENTIAL/PLATELET
Basophils Absolute: 0 10*3/uL (ref 0.0–0.1)
Eosinophils Absolute: 0 10*3/uL (ref 0.0–0.5)
HCT: 29.6 % — ABNORMAL LOW (ref 38.4–49.9)
LYMPH%: 43.2 % (ref 14.0–49.0)
MONO#: 0.1 10*3/uL (ref 0.1–0.9)
NEUT#: 1.3 10*3/uL — ABNORMAL LOW (ref 1.5–6.5)
NEUT%: 52.3 % (ref 39.0–75.0)
Platelets: 40 10*3/uL — ABNORMAL LOW (ref 140–400)
WBC: 2.4 10*3/uL — ABNORMAL LOW (ref 4.0–10.3)

## 2010-09-07 ENCOUNTER — Encounter (HOSPITAL_BASED_OUTPATIENT_CLINIC_OR_DEPARTMENT_OTHER): Payer: Medicare Other | Admitting: Oncology

## 2010-09-07 ENCOUNTER — Other Ambulatory Visit: Payer: Self-pay | Admitting: Oncology

## 2010-09-07 DIAGNOSIS — D469 Myelodysplastic syndrome, unspecified: Secondary | ICD-10-CM

## 2010-09-07 DIAGNOSIS — D649 Anemia, unspecified: Secondary | ICD-10-CM

## 2010-09-07 LAB — CBC WITH DIFFERENTIAL/PLATELET
Basophils Absolute: 0 10*3/uL (ref 0.0–0.1)
Eosinophils Absolute: 0 10*3/uL (ref 0.0–0.5)
HCT: 29.6 % — ABNORMAL LOW (ref 38.4–49.9)
HGB: 9.4 g/dL — ABNORMAL LOW (ref 13.0–17.1)
LYMPH%: 47.2 % (ref 14.0–49.0)
MCV: 95.2 fL (ref 79.3–98.0)
MONO%: 3 % (ref 0.0–14.0)
NEUT#: 1.1 10*3/uL — ABNORMAL LOW (ref 1.5–6.5)
Platelets: 51 10*3/uL — ABNORMAL LOW (ref 140–400)
RDW: 20.4 % — ABNORMAL HIGH (ref 11.0–14.6)

## 2010-09-07 LAB — TECHNOLOGIST REVIEW

## 2010-09-21 ENCOUNTER — Encounter (HOSPITAL_BASED_OUTPATIENT_CLINIC_OR_DEPARTMENT_OTHER): Payer: Medicare Other | Admitting: Oncology

## 2010-09-21 ENCOUNTER — Other Ambulatory Visit: Payer: Self-pay | Admitting: Oncology

## 2010-09-21 DIAGNOSIS — D638 Anemia in other chronic diseases classified elsewhere: Secondary | ICD-10-CM

## 2010-09-21 DIAGNOSIS — D649 Anemia, unspecified: Secondary | ICD-10-CM

## 2010-09-21 DIAGNOSIS — D469 Myelodysplastic syndrome, unspecified: Secondary | ICD-10-CM

## 2010-09-21 LAB — CBC WITH DIFFERENTIAL/PLATELET
Basophils Absolute: 0 10*3/uL (ref 0.0–0.1)
EOS%: 0.8 % (ref 0.0–7.0)
HCT: 29.7 % — ABNORMAL LOW (ref 38.4–49.9)
HGB: 9.5 g/dL — ABNORMAL LOW (ref 13.0–17.1)
MCH: 30.4 pg (ref 27.2–33.4)
MONO#: 0.1 10*3/uL (ref 0.1–0.9)
NEUT%: 55.1 % (ref 39.0–75.0)
lymph#: 1.1 10*3/uL (ref 0.9–3.3)

## 2010-10-12 ENCOUNTER — Other Ambulatory Visit: Payer: Self-pay | Admitting: Oncology

## 2010-10-12 ENCOUNTER — Encounter (HOSPITAL_BASED_OUTPATIENT_CLINIC_OR_DEPARTMENT_OTHER): Payer: Medicare Other | Admitting: Oncology

## 2010-10-12 DIAGNOSIS — D469 Myelodysplastic syndrome, unspecified: Secondary | ICD-10-CM

## 2010-10-12 DIAGNOSIS — D649 Anemia, unspecified: Secondary | ICD-10-CM

## 2010-10-12 LAB — CBC WITH DIFFERENTIAL/PLATELET
BASO%: 0 % (ref 0.0–2.0)
Basophils Absolute: 0 10*3/uL (ref 0.0–0.1)
EOS%: 1 % (ref 0.0–7.0)
Eosinophils Absolute: 0 10*3/uL (ref 0.0–0.5)
HCT: 30.5 % — ABNORMAL LOW (ref 38.4–49.9)
HGB: 9.7 g/dL — ABNORMAL LOW (ref 13.0–17.1)
LYMPH%: 41.6 % (ref 14.0–49.0)
MCH: 30.2 pg (ref 27.2–33.4)
MCHC: 31.8 g/dL — ABNORMAL LOW (ref 32.0–36.0)
MCV: 95 fL (ref 79.3–98.0)
MONO#: 0.1 10*3/uL (ref 0.1–0.9)
MONO%: 4.4 % (ref 0.0–14.0)
NEUT#: 1.6 10*3/uL (ref 1.5–6.5)
NEUT%: 53 % (ref 39.0–75.0)
Platelets: 52 10*3/uL — ABNORMAL LOW (ref 140–400)
RBC: 3.21 10*6/uL — ABNORMAL LOW (ref 4.20–5.82)
RDW: 19.5 % — ABNORMAL HIGH (ref 11.0–14.6)
WBC: 3 10*3/uL — ABNORMAL LOW (ref 4.0–10.3)
lymph#: 1.2 10*3/uL (ref 0.9–3.3)
nRBC: 0 % (ref 0–0)

## 2010-11-02 ENCOUNTER — Other Ambulatory Visit: Payer: Self-pay | Admitting: Oncology

## 2010-11-02 ENCOUNTER — Encounter (HOSPITAL_BASED_OUTPATIENT_CLINIC_OR_DEPARTMENT_OTHER): Payer: Medicare Other | Admitting: Oncology

## 2010-11-02 DIAGNOSIS — D469 Myelodysplastic syndrome, unspecified: Secondary | ICD-10-CM

## 2010-11-02 DIAGNOSIS — D649 Anemia, unspecified: Secondary | ICD-10-CM

## 2010-11-02 LAB — CBC WITH DIFFERENTIAL/PLATELET
BASO%: 0 % (ref 0.0–2.0)
EOS%: 1.1 % (ref 0.0–7.0)
HCT: 31.3 % — ABNORMAL LOW (ref 38.4–49.9)
LYMPH%: 43 % (ref 14.0–49.0)
MCH: 30.5 pg (ref 27.2–33.4)
MCHC: 31.6 g/dL — ABNORMAL LOW (ref 32.0–36.0)
NEUT%: 52 % (ref 39.0–75.0)
Platelets: 60 10*3/uL — ABNORMAL LOW (ref 140–400)

## 2010-11-25 ENCOUNTER — Other Ambulatory Visit: Payer: Self-pay | Admitting: Oncology

## 2010-11-25 ENCOUNTER — Encounter (HOSPITAL_BASED_OUTPATIENT_CLINIC_OR_DEPARTMENT_OTHER): Payer: Medicare Other | Admitting: Oncology

## 2010-11-25 DIAGNOSIS — D469 Myelodysplastic syndrome, unspecified: Secondary | ICD-10-CM

## 2010-11-25 DIAGNOSIS — D649 Anemia, unspecified: Secondary | ICD-10-CM

## 2010-11-25 LAB — CBC WITH DIFFERENTIAL/PLATELET
Basophils Absolute: 0 10*3/uL (ref 0.0–0.1)
EOS%: 1.6 % (ref 0.0–7.0)
HGB: 9.9 g/dL — ABNORMAL LOW (ref 13.0–17.1)
LYMPH%: 36.1 % (ref 14.0–49.0)
MCH: 30.2 pg (ref 27.2–33.4)
MCV: 94.2 fL (ref 79.3–98.0)
MONO%: 3.2 % (ref 0.0–14.0)
Platelets: 50 10*3/uL — ABNORMAL LOW (ref 140–400)
RBC: 3.28 10*6/uL — ABNORMAL LOW (ref 4.20–5.82)
RDW: 19 % — ABNORMAL HIGH (ref 11.0–14.6)

## 2010-11-25 LAB — TECHNOLOGIST REVIEW

## 2010-12-14 ENCOUNTER — Encounter (HOSPITAL_BASED_OUTPATIENT_CLINIC_OR_DEPARTMENT_OTHER): Payer: Medicare Other | Admitting: Oncology

## 2010-12-14 ENCOUNTER — Other Ambulatory Visit: Payer: Self-pay | Admitting: Oncology

## 2010-12-14 DIAGNOSIS — D469 Myelodysplastic syndrome, unspecified: Secondary | ICD-10-CM

## 2010-12-14 DIAGNOSIS — D649 Anemia, unspecified: Secondary | ICD-10-CM

## 2010-12-14 DIAGNOSIS — D510 Vitamin B12 deficiency anemia due to intrinsic factor deficiency: Secondary | ICD-10-CM

## 2010-12-14 LAB — MORPHOLOGY: PLT EST: DECREASED

## 2010-12-14 LAB — COMPREHENSIVE METABOLIC PANEL
ALT: 14 U/L (ref 0–53)
AST: 20 U/L (ref 0–37)
Chloride: 108 mEq/L (ref 96–112)
Creatinine, Ser: 1.21 mg/dL (ref 0.50–1.35)
Sodium: 141 mEq/L (ref 135–145)
Total Bilirubin: 1 mg/dL (ref 0.3–1.2)
Total Protein: 7.3 g/dL (ref 6.0–8.3)

## 2010-12-14 LAB — CBC WITH DIFFERENTIAL/PLATELET
Eosinophils Absolute: 0 10*3/uL (ref 0.0–0.5)
HCT: 31.7 % — ABNORMAL LOW (ref 38.4–49.9)
MCH: 30.1 pg (ref 27.2–33.4)
MCV: 94.6 fL (ref 79.3–98.0)
MONO#: 0.1 10*3/uL (ref 0.1–0.9)
RBC: 3.35 10*6/uL — ABNORMAL LOW (ref 4.20–5.82)
RDW: 19.5 % — ABNORMAL HIGH (ref 11.0–14.6)
nRBC: 0 % (ref 0–0)

## 2010-12-29 LAB — CROSSMATCH

## 2011-01-04 ENCOUNTER — Encounter (HOSPITAL_BASED_OUTPATIENT_CLINIC_OR_DEPARTMENT_OTHER): Payer: Medicare Other | Admitting: Oncology

## 2011-01-04 ENCOUNTER — Other Ambulatory Visit: Payer: Self-pay | Admitting: Oncology

## 2011-01-04 DIAGNOSIS — D469 Myelodysplastic syndrome, unspecified: Secondary | ICD-10-CM

## 2011-01-04 LAB — CROSSMATCH
ABO/RH(D): O POS
Antibody Screen: NEGATIVE

## 2011-01-04 LAB — CBC WITH DIFFERENTIAL/PLATELET
BASO%: 0.3 % (ref 0.0–2.0)
Basophils Absolute: 0 10*3/uL (ref 0.0–0.1)
EOS%: 0.7 % (ref 0.0–7.0)
Eosinophils Absolute: 0 10*3/uL (ref 0.0–0.5)
HCT: 32.5 % — ABNORMAL LOW (ref 38.4–49.9)
HGB: 10.3 g/dL — ABNORMAL LOW (ref 13.0–17.1)
LYMPH%: 39.1 % (ref 14.0–49.0)
MCH: 30.1 pg (ref 27.2–33.4)
MCHC: 31.7 g/dL — ABNORMAL LOW (ref 32.0–36.0)
MCV: 95 fL (ref 79.3–98.0)
MONO#: 0.1 10*3/uL (ref 0.1–0.9)
MONO%: 4.4 % (ref 0.0–14.0)
NEUT#: 1.7 10*3/uL (ref 1.5–6.5)
NEUT%: 55.5 % (ref 39.0–75.0)
Platelets: 70 10*3/uL — ABNORMAL LOW (ref 140–400)
RBC: 3.42 10*6/uL — ABNORMAL LOW (ref 4.20–5.82)
RDW: 19.2 % — ABNORMAL HIGH (ref 11.0–14.6)
WBC: 3 10*3/uL — ABNORMAL LOW (ref 4.0–10.3)
lymph#: 1.2 10*3/uL (ref 0.9–3.3)
nRBC: 0 % (ref 0–0)

## 2011-01-04 LAB — TECHNOLOGIST REVIEW

## 2011-01-04 LAB — ABO/RH: ABO/RH(D): O POS

## 2011-01-12 ENCOUNTER — Other Ambulatory Visit: Payer: Self-pay | Admitting: Oncology

## 2011-01-12 DIAGNOSIS — D469 Myelodysplastic syndrome, unspecified: Secondary | ICD-10-CM | POA: Insufficient documentation

## 2011-01-24 ENCOUNTER — Other Ambulatory Visit: Payer: Self-pay | Admitting: Oncology

## 2011-01-25 ENCOUNTER — Ambulatory Visit (HOSPITAL_BASED_OUTPATIENT_CLINIC_OR_DEPARTMENT_OTHER): Payer: Medicare Other

## 2011-01-25 ENCOUNTER — Other Ambulatory Visit (HOSPITAL_BASED_OUTPATIENT_CLINIC_OR_DEPARTMENT_OTHER): Payer: Medicare Other | Admitting: Lab

## 2011-01-25 ENCOUNTER — Other Ambulatory Visit: Payer: Self-pay | Admitting: Oncology

## 2011-01-25 VITALS — BP 127/63 | HR 89 | Temp 96.6°F

## 2011-01-25 DIAGNOSIS — D469 Myelodysplastic syndrome, unspecified: Secondary | ICD-10-CM

## 2011-01-25 LAB — CBC WITH DIFFERENTIAL/PLATELET
BASO%: 0.3 % (ref 0.0–2.0)
Basophils Absolute: 0 10e3/uL (ref 0.0–0.1)
EOS%: 1.1 % (ref 0.0–7.0)
Eosinophils Absolute: 0 10e3/uL (ref 0.0–0.5)
HCT: 28.5 % — ABNORMAL LOW (ref 38.4–49.9)
HGB: 9.4 g/dL — ABNORMAL LOW (ref 13.0–17.1)
LYMPH%: 40.9 % (ref 14.0–49.0)
MCH: 31.7 pg (ref 27.2–33.4)
MCHC: 33.1 g/dL (ref 32.0–36.0)
MCV: 95.8 fL (ref 79.3–98.0)
MONO#: 0.1 10e3/uL (ref 0.1–0.9)
MONO%: 3.2 % (ref 0.0–14.0)
NEUT#: 1.5 10e3/uL (ref 1.5–6.5)
NEUT%: 54.5 % (ref 39.0–75.0)
Platelets: 66 10e3/uL — ABNORMAL LOW (ref 140–400)
RBC: 2.97 10e6/uL — ABNORMAL LOW (ref 4.20–5.82)
RDW: 21.2 % — ABNORMAL HIGH (ref 11.0–14.6)
WBC: 2.8 10e3/uL — ABNORMAL LOW (ref 4.0–10.3)
lymph#: 1.1 10e3/uL (ref 0.9–3.3)

## 2011-01-25 MED ORDER — DARBEPOETIN ALFA-POLYSORBATE 500 MCG/ML IJ SOLN
300.0000 ug | Freq: Once | INTRAMUSCULAR | Status: AC
  Administered 2011-01-25: 300 ug via SUBCUTANEOUS
  Filled 2011-01-25: qty 1

## 2011-02-15 ENCOUNTER — Ambulatory Visit (HOSPITAL_BASED_OUTPATIENT_CLINIC_OR_DEPARTMENT_OTHER): Payer: Medicare Other

## 2011-02-15 ENCOUNTER — Other Ambulatory Visit (HOSPITAL_BASED_OUTPATIENT_CLINIC_OR_DEPARTMENT_OTHER): Payer: Medicare Other

## 2011-02-15 ENCOUNTER — Other Ambulatory Visit: Payer: Self-pay | Admitting: Oncology

## 2011-02-15 VITALS — BP 121/63 | HR 91 | Temp 97.2°F

## 2011-02-15 DIAGNOSIS — D469 Myelodysplastic syndrome, unspecified: Secondary | ICD-10-CM

## 2011-02-15 LAB — TECHNOLOGIST REVIEW

## 2011-02-15 LAB — CBC WITH DIFFERENTIAL/PLATELET
BASO%: 0.4 % (ref 0.0–2.0)
Basophils Absolute: 0 10*3/uL (ref 0.0–0.1)
EOS%: 1.2 % (ref 0.0–7.0)
HGB: 10 g/dL — ABNORMAL LOW (ref 13.0–17.1)
MCH: 30.2 pg (ref 27.2–33.4)
MCHC: 31.6 g/dL — ABNORMAL LOW (ref 32.0–36.0)
MONO#: 0.1 10*3/uL (ref 0.1–0.9)
RDW: 19.6 % — ABNORMAL HIGH (ref 11.0–14.6)
WBC: 2.6 10*3/uL — ABNORMAL LOW (ref 4.0–10.3)
lymph#: 1 10*3/uL (ref 0.9–3.3)

## 2011-02-15 MED ORDER — DARBEPOETIN ALFA-POLYSORBATE 500 MCG/ML IJ SOLN
300.0000 ug | Freq: Once | INTRAMUSCULAR | Status: AC
  Administered 2011-02-15: 300 ug via SUBCUTANEOUS
  Filled 2011-02-15: qty 1

## 2011-02-17 ENCOUNTER — Encounter: Payer: Self-pay | Admitting: *Deleted

## 2011-02-17 NOTE — Progress Notes (Signed)
Copy of lab done 02/15/11 sent electronically to Dr. Frederik Pear.

## 2011-02-18 DIAGNOSIS — L821 Other seborrheic keratosis: Secondary | ICD-10-CM

## 2011-02-18 DIAGNOSIS — M25569 Pain in unspecified knee: Secondary | ICD-10-CM

## 2011-02-18 HISTORY — DX: Other seborrheic keratosis: L82.1

## 2011-02-18 HISTORY — DX: Pain in unspecified knee: M25.569

## 2011-03-08 ENCOUNTER — Encounter: Payer: Self-pay | Admitting: Oncology

## 2011-03-08 ENCOUNTER — Ambulatory Visit (HOSPITAL_BASED_OUTPATIENT_CLINIC_OR_DEPARTMENT_OTHER): Payer: Medicare Other | Admitting: Oncology

## 2011-03-08 ENCOUNTER — Other Ambulatory Visit (HOSPITAL_BASED_OUTPATIENT_CLINIC_OR_DEPARTMENT_OTHER): Payer: Medicare Other | Admitting: Lab

## 2011-03-08 ENCOUNTER — Other Ambulatory Visit: Payer: Self-pay | Admitting: Oncology

## 2011-03-08 ENCOUNTER — Telehealth: Payer: Self-pay | Admitting: Oncology

## 2011-03-08 VITALS — BP 145/65 | HR 83 | Temp 96.7°F | Ht 65.0 in | Wt 156.1 lb

## 2011-03-08 DIAGNOSIS — M47816 Spondylosis without myelopathy or radiculopathy, lumbar region: Secondary | ICD-10-CM

## 2011-03-08 DIAGNOSIS — N400 Enlarged prostate without lower urinary tract symptoms: Secondary | ICD-10-CM

## 2011-03-08 DIAGNOSIS — E039 Hypothyroidism, unspecified: Secondary | ICD-10-CM

## 2011-03-08 DIAGNOSIS — D469 Myelodysplastic syndrome, unspecified: Secondary | ICD-10-CM

## 2011-03-08 DIAGNOSIS — Z8719 Personal history of other diseases of the digestive system: Secondary | ICD-10-CM | POA: Insufficient documentation

## 2011-03-08 DIAGNOSIS — D510 Vitamin B12 deficiency anemia due to intrinsic factor deficiency: Secondary | ICD-10-CM

## 2011-03-08 DIAGNOSIS — Z9889 Other specified postprocedural states: Secondary | ICD-10-CM | POA: Insufficient documentation

## 2011-03-08 DIAGNOSIS — D649 Anemia, unspecified: Secondary | ICD-10-CM

## 2011-03-08 HISTORY — DX: Benign prostatic hyperplasia without lower urinary tract symptoms: N40.0

## 2011-03-08 HISTORY — DX: Spondylosis without myelopathy or radiculopathy, lumbar region: M47.816

## 2011-03-08 HISTORY — DX: Personal history of other diseases of the digestive system: Z87.19

## 2011-03-08 HISTORY — DX: Vitamin B12 deficiency anemia due to intrinsic factor deficiency: D51.0

## 2011-03-08 HISTORY — DX: Hypothyroidism, unspecified: E03.9

## 2011-03-08 LAB — CBC WITH DIFFERENTIAL/PLATELET
BASO%: 0 % (ref 0.0–2.0)
MCHC: 31.9 g/dL — ABNORMAL LOW (ref 32.0–36.0)
MONO#: 0 10*3/uL — ABNORMAL LOW (ref 0.1–0.9)
RBC: 3.11 10*6/uL — ABNORMAL LOW (ref 4.20–5.82)
WBC: 2.6 10*3/uL — ABNORMAL LOW (ref 4.0–10.3)
lymph#: 1.1 10*3/uL (ref 0.9–3.3)
nRBC: 0 % (ref 0–0)

## 2011-03-08 LAB — MORPHOLOGY

## 2011-03-08 MED ORDER — DARBEPOETIN ALFA-POLYSORBATE 500 MCG/ML IJ SOLN
300.0000 ug | Freq: Once | INTRAMUSCULAR | Status: AC
  Administered 2011-03-08: 300 ug via SUBCUTANEOUS
  Filled 2011-03-08: qty 1

## 2011-03-08 NOTE — Telephone Encounter (Signed)
gve the pt his jan-April 2013 appt calendar

## 2011-03-08 NOTE — Progress Notes (Signed)
Dragon failure see dictated note.

## 2011-03-08 NOTE — Progress Notes (Signed)
CC:   Dwayne Stuart, M.D.  HISTORY OF PRESENT ILLNESS:  This is a followup visit for this delightful, now 77 year old man with a myelodysplastic syndrome presenting with pancytopenia and initially transfusion-dependent anemia back in March 2012.  The hemoglobin was 5 at that time, white count 3,000, and platelets 91,000 with subsequent fall to 43,000.  A bone marrow aspiration and biopsy done on 06/04/2010 showed dysplastic changes in all 3 cell lines.  Chromosome studies were normal.  The 5q minus abnormality was not found.  No ring sideroblasts.  Serum LDH was borderline elevated at 267.  Retic count was 2%.  He was already on B12 injections for presumed pernicious anemia and B12 level was low normal at 254 with folic acid 4.1.  Ferritin level was 394.  Baseline erythropoietin was 79.  I felt he would be a good candidate for a trial of an erythropoietin stimulating agent.  He was started on Aranesp injections.  He had a nice response and we have been able to maintain his hemoglobins at 10 grams. Over time, his white count and platelets have remained stable.  The white counts are in the 26-2,800 range with remarkably normal differential and platelet counts in the 55-65,000 range.  He is tolerating Aranesp injections well.  I have been able to increase the interval between injections to every 3 weeks.  He has had no interim medical problems.  No flare-ups of arrhythmia.  He denies any chest pain, dyspnea, or palpitations.  He is still quite sharp mentally and we had a nice long conversations about many issues today.  PHYSICAL EXAMINATION:  General Appearance:  A pleasant, elderly man who is hard of hearing and is wearing hearing aids.  Oropharynx:  No erythema or exudate.  Lungs:  Clear and resonant to percussion.  Heart: Regular cardiac rhythm.  No murmur.  No lymphadenopathy.  Abdomen: Soft, nontender.  No mass.  No organomegaly.  Extremities:  No edema. No calf tenderness.   Neurologic:  No focal deficit, except decreased hearing.  LABORATORY DATA:  Hemoglobin 9.5, hematocrit 29.8, MCV 96, white count 2,600, 56 neutrophils, 41 lymphocytes, 2 monocytes, 1 eosinophil, platelets 61,000.  IMPRESSION: 1. Myelodysplastic syndrome.  Excellent partial response to Aranesp:     The plan is to continue the same. 2. Pernicious anemia, on monthly B12 injections. 3. Hypothyroid, on replacement. 4. Degenerative arthritis. 5. Benign prostatic hyperplasia. 6. History of repair of a Zenker's diverticulum.    ______________________________ Dwayne Stuart, M.D., F.A.C.P. JMG/MEDQ  D:  03/08/2011  T:  03/08/2011  Job:  [id number]

## 2011-03-29 ENCOUNTER — Other Ambulatory Visit: Payer: Self-pay | Admitting: Physician Assistant

## 2011-03-29 ENCOUNTER — Ambulatory Visit (HOSPITAL_BASED_OUTPATIENT_CLINIC_OR_DEPARTMENT_OTHER): Payer: Medicare Other

## 2011-03-29 ENCOUNTER — Telehealth: Payer: Self-pay | Admitting: *Deleted

## 2011-03-29 ENCOUNTER — Other Ambulatory Visit (HOSPITAL_BASED_OUTPATIENT_CLINIC_OR_DEPARTMENT_OTHER): Payer: Medicare Other

## 2011-03-29 VITALS — BP 119/57 | HR 99 | Temp 96.7°F

## 2011-03-29 DIAGNOSIS — D649 Anemia, unspecified: Secondary | ICD-10-CM

## 2011-03-29 DIAGNOSIS — D469 Myelodysplastic syndrome, unspecified: Secondary | ICD-10-CM

## 2011-03-29 LAB — CBC WITH DIFFERENTIAL/PLATELET
Basophils Absolute: 0 10*3/uL (ref 0.0–0.1)
Eosinophils Absolute: 0 10*3/uL (ref 0.0–0.5)
HCT: 26.9 % — ABNORMAL LOW (ref 38.4–49.9)
HGB: 8.5 g/dL — ABNORMAL LOW (ref 13.0–17.1)
NEUT#: 1.9 10*3/uL (ref 1.5–6.5)
NEUT%: 59.2 % (ref 39.0–75.0)
RDW: 19 % — ABNORMAL HIGH (ref 11.0–14.6)
lymph#: 1.1 10*3/uL (ref 0.9–3.3)

## 2011-03-29 LAB — TECHNOLOGIST REVIEW

## 2011-03-29 MED ORDER — DARBEPOETIN ALFA-POLYSORBATE 500 MCG/ML IJ SOLN
300.0000 ug | Freq: Once | INTRAMUSCULAR | Status: AC
  Administered 2011-03-29: 300 ug via SUBCUTANEOUS
  Filled 2011-03-29: qty 1

## 2011-03-29 NOTE — Telephone Encounter (Signed)
Called patient to check on him.  Today's lab revealed hgb = 8.5.  Dwayne Stuart says he suspected this as he has not been out of the house since before Christmas.  Has had the bug that's been going around and had aches and fever.  Reports he is always short of breath but no worsening of his dyspnea with this "bug and low hgb".  Denies chest pain.  Asked that he call our office if he feels worse.  Thanked me for calling and will call if needed.

## 2011-03-30 ENCOUNTER — Encounter: Payer: Self-pay | Admitting: *Deleted

## 2011-03-30 NOTE — Progress Notes (Signed)
Dr. Cyndie Chime states that he would not transfuse this pt at this level hgb 8.5 03/29/11 but repeat cbc at time of next aranesp.  This is already ordered.

## 2011-04-19 ENCOUNTER — Ambulatory Visit: Payer: Medicare Other

## 2011-04-19 ENCOUNTER — Other Ambulatory Visit: Payer: Medicare Other | Admitting: Lab

## 2011-05-10 ENCOUNTER — Other Ambulatory Visit: Payer: Medicare Other | Admitting: Lab

## 2011-05-10 ENCOUNTER — Ambulatory Visit (HOSPITAL_BASED_OUTPATIENT_CLINIC_OR_DEPARTMENT_OTHER): Payer: Medicare Other

## 2011-05-10 VITALS — BP 146/65 | HR 85 | Temp 97.2°F

## 2011-05-10 DIAGNOSIS — D469 Myelodysplastic syndrome, unspecified: Secondary | ICD-10-CM

## 2011-05-10 LAB — COMPREHENSIVE METABOLIC PANEL
ALT: 15 U/L (ref 0–53)
AST: 20 U/L (ref 0–37)
Albumin: 3.5 g/dL (ref 3.5–5.2)
BUN: 31 mg/dL — ABNORMAL HIGH (ref 6–23)
Calcium: 8.8 mg/dL (ref 8.4–10.5)
Chloride: 106 mEq/L (ref 96–112)
Potassium: 4.7 mEq/L (ref 3.5–5.3)
Total Protein: 7 g/dL (ref 6.0–8.3)

## 2011-05-10 LAB — CBC WITH DIFFERENTIAL/PLATELET
EOS%: 1.1 % (ref 0.0–7.0)
HCT: 28.9 % — ABNORMAL LOW (ref 38.4–49.9)
HGB: 9.2 g/dL — ABNORMAL LOW (ref 13.0–17.1)
MCH: 30.5 pg (ref 27.2–33.4)
MCHC: 31.8 g/dL — ABNORMAL LOW (ref 32.0–36.0)
MCV: 95.7 fL (ref 79.3–98.0)
MONO%: 1.5 % (ref 0.0–14.0)
NEUT#: 1.5 10*3/uL (ref 1.5–6.5)
RBC: 3.02 10*6/uL — ABNORMAL LOW (ref 4.20–5.82)
RDW: 20.1 % — ABNORMAL HIGH (ref 11.0–14.6)
WBC: 2.7 10*3/uL — ABNORMAL LOW (ref 4.0–10.3)
nRBC: 0 % (ref 0–0)

## 2011-05-10 MED ORDER — DARBEPOETIN ALFA-POLYSORBATE 300 MCG/0.6ML IJ SOLN
300.0000 ug | Freq: Once | INTRAMUSCULAR | Status: AC
  Administered 2011-05-10: 300 ug via SUBCUTANEOUS
  Filled 2011-05-10: qty 0.6

## 2011-05-15 DIAGNOSIS — H0230 Blepharochalasis unspecified eye, unspecified eyelid: Secondary | ICD-10-CM

## 2011-05-15 HISTORY — DX: Blepharochalasis unspecified eye, unspecified eyelid: H02.30

## 2011-05-31 ENCOUNTER — Ambulatory Visit (HOSPITAL_BASED_OUTPATIENT_CLINIC_OR_DEPARTMENT_OTHER): Payer: Medicare Other | Admitting: Nurse Practitioner

## 2011-05-31 ENCOUNTER — Other Ambulatory Visit (HOSPITAL_BASED_OUTPATIENT_CLINIC_OR_DEPARTMENT_OTHER): Payer: Medicare Other | Admitting: Lab

## 2011-05-31 ENCOUNTER — Ambulatory Visit: Payer: Medicare Other | Admitting: Oncology

## 2011-05-31 VITALS — BP 116/64 | HR 74 | Temp 97.3°F | Ht 65.0 in | Wt 148.7 lb

## 2011-05-31 DIAGNOSIS — D469 Myelodysplastic syndrome, unspecified: Secondary | ICD-10-CM

## 2011-05-31 LAB — CBC WITH DIFFERENTIAL/PLATELET
BASO%: 0.4 % (ref 0.0–2.0)
LYMPH%: 40.7 % (ref 14.0–49.0)
MCH: 30.3 pg (ref 27.2–33.4)
MCHC: 31.5 g/dL — ABNORMAL LOW (ref 32.0–36.0)
MCV: 96.1 fL (ref 79.3–98.0)
MONO%: 2.6 % (ref 0.0–14.0)
Platelets: 76 10*3/uL — ABNORMAL LOW (ref 140–400)
RBC: 2.84 10*6/uL — ABNORMAL LOW (ref 4.20–5.82)
nRBC: 0 % (ref 0–0)

## 2011-05-31 MED ORDER — DARBEPOETIN ALFA-POLYSORBATE 300 MCG/0.6ML IJ SOLN
300.0000 ug | Freq: Once | INTRAMUSCULAR | Status: AC
  Administered 2011-05-31: 300 ug via SUBCUTANEOUS
  Filled 2011-05-31: qty 0.6

## 2011-05-31 NOTE — Progress Notes (Signed)
OFFICE PROGRESS NOTE  Interval history:  [name] is a 78 year old man with a myelodysplastic syndrome. He presented with pancytopenia and initially transfusion dependent anemia in March 2012. Bone marrow aspiration and biopsy 06/04/2010 showed dysplastic changes in all 3 cell lines. Chromosome studies were normal. The 5 q. minus abnormality was not found. There were no ring sideroblasts. He was started on Aranesp injections. He is currently receiving the injections on a 3 week schedule. He is seen today for scheduled followup.  [name] reports a recent cold as well as a recent urinary tract infection. He completed a course of antibiotics. He did not feel well while taking the antibiotics. He lost his appetite. Since completing the antibiotic his appetite has returned and his energy level has improved.   Objective: Blood pressure 116/64, pulse 74, temperature 97.3 F (36.3 C), temperature source Oral, height 5\' 5"  (1.651 m), weight 148 lb 11.2 oz (67.45 kg).  He was examined in the wheelchair as he did not feel he could safely maneuver onto the exam table. Oropharynx is without thrush or ulceration. No palpable cervical, supraclavicular or axillary lymph nodes. Lungs are clear. Regular cardiac rhythm. Abdomen is soft and nontender. Extremities are without edema.  Lab Results: Lab Results  Component Value Date   WBC 2.3* 05/31/2011   HGB 8.6* 05/31/2011   HCT 27.3* 05/31/2011   MCV 96.1 05/31/2011   PLT 76* 05/31/2011    Chemistry:    Chemistry      Component Value Date/Time   NA 139 05/10/2011 1246   K 4.7 05/10/2011 1246   CL 106 05/10/2011 1246   CO2 24 05/10/2011 1246   BUN 31* 05/10/2011 1246   CREATININE 1.21 05/10/2011 1246      Component Value Date/Time   CALCIUM 8.8 05/10/2011 1246   ALKPHOS 81 05/10/2011 1246   AST 20 05/10/2011 1246   ALT 15 05/10/2011 1246   BILITOT 0.7 05/10/2011 1246       Studies/Results: No results found.  Medications: I have reviewed the patient's  current medications.  Assessment/Plan:  1. Myelodysplastic syndrome currently receiving Aranesp 300 mcg every 3 weeks.  Of note, he missed an injection on 04/19/2011. 2. Hypothyroid on replacement. 3. History of previous pernicious anemia on monthly B12 injections. 4. History of repair of Zenker's diverticulum. 5. Degenerative arthritis. 6. Benign prostatic hypertrophy.  Disposition-plan to continue Aranesp 300 mcg every 3 weeks. He will return for a followup visit in 3 months. He will contact the office in the interim with any problems.  Lonna Cobb ANP/GNP-BC

## 2011-06-04 ENCOUNTER — Telehealth: Payer: Self-pay | Admitting: Oncology

## 2011-06-04 NOTE — Telephone Encounter (Signed)
called pt and informed him of appt for 04/01 and to pick up scheduled for [id number]

## 2011-06-21 ENCOUNTER — Other Ambulatory Visit: Payer: Medicare Other | Admitting: Lab

## 2011-06-21 ENCOUNTER — Other Ambulatory Visit: Payer: Medicare Other

## 2011-06-21 ENCOUNTER — Ambulatory Visit (HOSPITAL_BASED_OUTPATIENT_CLINIC_OR_DEPARTMENT_OTHER): Payer: Medicare Other

## 2011-06-21 VITALS — BP 115/61 | HR 79 | Temp 97.6°F

## 2011-06-21 DIAGNOSIS — D469 Myelodysplastic syndrome, unspecified: Secondary | ICD-10-CM

## 2011-06-21 LAB — CBC WITH DIFFERENTIAL/PLATELET
BASO%: 0.3 % (ref 0.0–2.0)
EOS%: 1.1 % (ref 0.0–7.0)
LYMPH%: 40.4 % (ref 14.0–49.0)
MCH: 32 pg (ref 27.2–33.4)
MCHC: 32.2 g/dL (ref 32.0–36.0)
MONO#: 0.1 10*3/uL (ref 0.1–0.9)
RBC: 2.74 10*6/uL — ABNORMAL LOW (ref 4.20–5.82)
WBC: 2.3 10*3/uL — ABNORMAL LOW (ref 4.0–10.3)
lymph#: 0.9 10*3/uL (ref 0.9–3.3)
nRBC: 0 % (ref 0–0)

## 2011-06-21 MED ORDER — DARBEPOETIN ALFA-POLYSORBATE 300 MCG/0.6ML IJ SOLN
300.0000 ug | Freq: Once | INTRAMUSCULAR | Status: AC
  Administered 2011-06-21: 300 ug via SUBCUTANEOUS
  Filled 2011-06-21: qty 0.6

## 2011-06-23 ENCOUNTER — Other Ambulatory Visit: Payer: Self-pay

## 2011-06-23 ENCOUNTER — Other Ambulatory Visit: Payer: Self-pay | Admitting: Oncology

## 2011-06-23 ENCOUNTER — Other Ambulatory Visit: Payer: Self-pay | Admitting: *Deleted

## 2011-06-23 DIAGNOSIS — D469 Myelodysplastic syndrome, unspecified: Secondary | ICD-10-CM

## 2011-07-12 ENCOUNTER — Ambulatory Visit (HOSPITAL_BASED_OUTPATIENT_CLINIC_OR_DEPARTMENT_OTHER): Payer: Medicare Other

## 2011-07-12 ENCOUNTER — Other Ambulatory Visit (HOSPITAL_BASED_OUTPATIENT_CLINIC_OR_DEPARTMENT_OTHER): Payer: Medicare Other | Admitting: Lab

## 2011-07-12 VITALS — BP 108/66 | HR 86

## 2011-07-12 DIAGNOSIS — D469 Myelodysplastic syndrome, unspecified: Secondary | ICD-10-CM

## 2011-07-12 LAB — CBC WITH DIFFERENTIAL/PLATELET
BASO%: 1 % (ref 0.0–2.0)
Basophils Absolute: 0 10*3/uL (ref 0.0–0.1)
EOS%: 1.1 % (ref 0.0–7.0)
HGB: 8.2 g/dL — ABNORMAL LOW (ref 13.0–17.1)
MCH: 31.1 pg (ref 27.2–33.4)
MCV: 98.2 fL — ABNORMAL HIGH (ref 79.3–98.0)
MONO%: 5.1 % (ref 0.0–14.0)
RBC: 2.65 10*6/uL — ABNORMAL LOW (ref 4.20–5.82)
RDW: 19.4 % — ABNORMAL HIGH (ref 11.0–14.6)
lymph#: 0.7 10*3/uL — ABNORMAL LOW (ref 0.9–3.3)
nRBC: 0 % (ref 0–0)

## 2011-07-12 MED ORDER — DARBEPOETIN ALFA-POLYSORBATE 300 MCG/0.6ML IJ SOLN
300.0000 ug | Freq: Once | INTRAMUSCULAR | Status: AC
  Administered 2011-07-12: 300 ug via SUBCUTANEOUS
  Filled 2011-07-12: qty 0.6

## 2011-08-02 ENCOUNTER — Ambulatory Visit (HOSPITAL_BASED_OUTPATIENT_CLINIC_OR_DEPARTMENT_OTHER): Payer: Medicare Other

## 2011-08-02 ENCOUNTER — Other Ambulatory Visit (HOSPITAL_BASED_OUTPATIENT_CLINIC_OR_DEPARTMENT_OTHER): Payer: Medicare Other | Admitting: Lab

## 2011-08-02 VITALS — BP 119/65 | HR 83 | Temp 96.9°F

## 2011-08-02 DIAGNOSIS — D469 Myelodysplastic syndrome, unspecified: Secondary | ICD-10-CM

## 2011-08-02 LAB — CBC WITH DIFFERENTIAL/PLATELET
BASO%: 0.4 % (ref 0.0–2.0)
Eosinophils Absolute: 0 10*3/uL (ref 0.0–0.5)
LYMPH%: 43.9 % (ref 14.0–49.0)
MCHC: 31.9 g/dL — ABNORMAL LOW (ref 32.0–36.0)
MONO#: 0.1 10*3/uL (ref 0.1–0.9)
NEUT#: 1.1 10*3/uL — ABNORMAL LOW (ref 1.5–6.5)
Platelets: 78 10*3/uL — ABNORMAL LOW (ref 140–400)
RBC: 3.16 10*6/uL — ABNORMAL LOW (ref 4.20–5.82)
RDW: 20.7 % — ABNORMAL HIGH (ref 11.0–14.6)
WBC: 2.2 10*3/uL — ABNORMAL LOW (ref 4.0–10.3)
lymph#: 1 10*3/uL (ref 0.9–3.3)
nRBC: 0 % (ref 0–0)

## 2011-08-02 MED ORDER — DARBEPOETIN ALFA-POLYSORBATE 300 MCG/0.6ML IJ SOLN
300.0000 ug | Freq: Once | INTRAMUSCULAR | Status: AC
  Administered 2011-08-02: 300 ug via SUBCUTANEOUS

## 2011-08-05 DIAGNOSIS — R0902 Hypoxemia: Secondary | ICD-10-CM

## 2011-08-05 DIAGNOSIS — I10 Essential (primary) hypertension: Secondary | ICD-10-CM

## 2011-08-05 DIAGNOSIS — R0602 Shortness of breath: Secondary | ICD-10-CM

## 2011-08-05 DIAGNOSIS — R32 Unspecified urinary incontinence: Secondary | ICD-10-CM

## 2011-08-05 HISTORY — DX: Essential (primary) hypertension: I10

## 2011-08-05 HISTORY — DX: Shortness of breath: R06.02

## 2011-08-05 HISTORY — DX: Unspecified urinary incontinence: R32

## 2011-08-05 HISTORY — DX: Hypoxemia: R09.02

## 2011-08-27 ENCOUNTER — Other Ambulatory Visit (HOSPITAL_BASED_OUTPATIENT_CLINIC_OR_DEPARTMENT_OTHER): Payer: Medicare Other | Admitting: Lab

## 2011-08-27 ENCOUNTER — Ambulatory Visit (HOSPITAL_BASED_OUTPATIENT_CLINIC_OR_DEPARTMENT_OTHER): Payer: Medicare Other | Admitting: Oncology

## 2011-08-27 VITALS — BP 136/64 | HR 78 | Temp 96.8°F | Resp 20 | Ht 68.0 in | Wt 148.9 lb

## 2011-08-27 DIAGNOSIS — N400 Enlarged prostate without lower urinary tract symptoms: Secondary | ICD-10-CM

## 2011-08-27 DIAGNOSIS — D469 Myelodysplastic syndrome, unspecified: Secondary | ICD-10-CM

## 2011-08-27 DIAGNOSIS — D4620 Refractory anemia with excess of blasts, unspecified: Secondary | ICD-10-CM

## 2011-08-27 DIAGNOSIS — D510 Vitamin B12 deficiency anemia due to intrinsic factor deficiency: Secondary | ICD-10-CM

## 2011-08-27 LAB — CBC WITH DIFFERENTIAL/PLATELET
Basophils Absolute: 0 10*3/uL (ref 0.0–0.1)
EOS%: 1 % (ref 0.0–7.0)
Eosinophils Absolute: 0 10*3/uL (ref 0.0–0.5)
HGB: 9.2 g/dL — ABNORMAL LOW (ref 13.0–17.1)
LYMPH%: 36.5 % (ref 14.0–49.0)
MCH: 32.3 pg (ref 27.2–33.4)
MCV: 99.5 fL — ABNORMAL HIGH (ref 79.3–98.0)
MONO%: 3.3 % (ref 0.0–14.0)
NEUT#: 1.3 10*3/uL — ABNORMAL LOW (ref 1.5–6.5)
Platelets: 59 10*3/uL — ABNORMAL LOW (ref 140–400)
RBC: 2.84 10*6/uL — ABNORMAL LOW (ref 4.20–5.82)
RDW: 21.4 % — ABNORMAL HIGH (ref 11.0–14.6)

## 2011-08-27 MED ORDER — DARBEPOETIN ALFA-POLYSORBATE 300 MCG/0.6ML IJ SOLN
300.0000 ug | Freq: Once | INTRAMUSCULAR | Status: AC
  Administered 2011-08-27: 300 ug via SUBCUTANEOUS
  Filled 2011-08-27: qty 0.6

## 2011-08-27 NOTE — Progress Notes (Signed)
Hematology and Oncology Follow Up Visit  Dwayne Stuart [id number] 12/12/1914 78 y.o. 08/27/2011 5:30 PM   Principle Diagnosis: Encounter Diagnoses  Name Primary?  . MDS (myelodysplastic syndrome), low grade Yes  . Myelodysplastic syndrome, unspecified      Interim History:   Followup visit for this pleasant 78 year old man with a myelodysplastic syndrome. He presented with pancytopenia and initially transfusion dependent anemia in March 2012. Bone marrow aspiration and biopsy 06/04/2010 showed dysplastic changes in all 3 cell lines. Chromosome studies were normal. The 5 q. minus abnormality was not found. There were no ring sideroblasts. He was started on Aranesp injections. He is currently receiving the injections on a Q 3 week schedule. He has been transfusion independent since starting the Aranesp at time of diagnosis. Overall, counts have been stable with some fluctuations. Average hemoglobin has been 9 g. White count 2200. Platelet count ranging from 59,000-99,000. He has not had any problems with infections. He is asymptomatic.   Medications: reviewed  Allergies: No Known Allergies  Review of Systems: Constitutional:   No constitutional symptoms Respiratory: No cough or dyspnea Cardiovascular:  No chest pain or palpitations Gastrointestinal: No abdominal pain no change in bowel habit. Chronic constipation. He uses when necessary laxatives. Genito-Urinary: Nocturia x5 Musculoskeletal: No muscle or bone pain Neurologic: No headache or change in vision Skin: No rash or ecchymosis Remaining ROS negative.  Physical Exam: Blood pressure 136/64, pulse 78, temperature 96.8 F (36 C), temperature source Oral, resp. rate 20, height 5\' 8"  (1.727 m), weight 148 lb 14.4 oz (67.541 kg). Wt Readings from Last 3 Encounters:  08/27/11 148 lb 14.4 oz (67.541 kg)  05/31/11 148 lb 11.2 oz (67.45 kg)  03/08/11 156 lb 1.6 oz (70.806 kg)     General appearance: Thin Caucasian man HENNT:  Pharynx no erythema or exudate. Lymph nodes: No adenopathy Breasts: Lungs: Clear to auscultation resonant to percussion Heart: Regular rhythm with a 1/6 aortic systolic murmur and a 2/6 mitral regurgitation murmur Abdomen: Soft nontender Extremities: No edema no calf tenderness Vascular: No cyanosis Neurologic: Decreased hearing and he is wearing hearing aids. Skin: No rash or ecchymosis  Lab Results: Lab Results  Component Value Date   WBC 2.2* 08/27/2011   HGB 9.2* 08/27/2011   HCT 28.3* 08/27/2011   MCV 99.5* 08/27/2011   PLT 59* 08/27/2011     Chemistry      Component Value Date/Time   NA 139 05/10/2011 1246   K 4.7 05/10/2011 1246   CL 106 05/10/2011 1246   CO2 24 05/10/2011 1246   BUN 31* 05/10/2011 1246   CREATININE 1.21 05/10/2011 1246      Component Value Date/Time   CALCIUM 8.8 05/10/2011 1246   ALKPHOS 81 05/10/2011 1246   AST 20 05/10/2011 1246   ALT 15 05/10/2011 1246   BILITOT 0.7 05/10/2011 1246       Radiological Studies:  Impression and Plan: #1. Low intermediate grade myelodysplastic syndrome. Stable pancytopenia now over one year of observation. Transfusion independent since starting Aranesp. Plan: Continue every 3 week Aranesp injections. #2. Hypothyroid on replacement. #3. Pernicious anemia on monthly B12 injections #4. Benign prostatic hypertrophy likely etiology of his nocturia he continues on Flomax. #5. Degenerative arthritis    CC:. Dr. Murray Hodgkins; Dr. Rollene Rotunda   [name], MD 6/7/20135:30 PM

## 2011-08-30 ENCOUNTER — Other Ambulatory Visit: Payer: Self-pay | Admitting: *Deleted

## 2011-08-30 DIAGNOSIS — D4620 Refractory anemia with excess of blasts, unspecified: Secondary | ICD-10-CM

## 2011-09-17 ENCOUNTER — Ambulatory Visit (HOSPITAL_BASED_OUTPATIENT_CLINIC_OR_DEPARTMENT_OTHER): Payer: Medicare Other

## 2011-09-17 ENCOUNTER — Other Ambulatory Visit (HOSPITAL_BASED_OUTPATIENT_CLINIC_OR_DEPARTMENT_OTHER): Payer: Medicare Other | Admitting: Lab

## 2011-09-17 VITALS — BP 127/56 | HR 79 | Temp 96.9°F

## 2011-09-17 DIAGNOSIS — D469 Myelodysplastic syndrome, unspecified: Secondary | ICD-10-CM

## 2011-09-17 DIAGNOSIS — D4620 Refractory anemia with excess of blasts, unspecified: Secondary | ICD-10-CM

## 2011-09-17 DIAGNOSIS — D46Z Other myelodysplastic syndromes: Secondary | ICD-10-CM

## 2011-09-17 LAB — CBC WITH DIFFERENTIAL/PLATELET
BASO%: 0 % (ref 0.0–2.0)
Eosinophils Absolute: 0 10*3/uL (ref 0.0–0.5)
HCT: 28.5 % — ABNORMAL LOW (ref 38.4–49.9)
MCHC: 31.6 g/dL — ABNORMAL LOW (ref 32.0–36.0)
MONO#: 0.1 10*3/uL (ref 0.1–0.9)
NEUT#: 1.3 10*3/uL — ABNORMAL LOW (ref 1.5–6.5)
Platelets: 43 10*3/uL — ABNORMAL LOW (ref 140–400)
RBC: 2.93 10*6/uL — ABNORMAL LOW (ref 4.20–5.82)
WBC: 2.5 10*3/uL — ABNORMAL LOW (ref 4.0–10.3)
lymph#: 1.1 10*3/uL (ref 0.9–3.3)
nRBC: 0 % (ref 0–0)

## 2011-09-17 LAB — MORPHOLOGY

## 2011-09-17 MED ORDER — DARBEPOETIN ALFA-POLYSORBATE 300 MCG/0.6ML IJ SOLN
300.0000 ug | Freq: Once | INTRAMUSCULAR | Status: AC
  Administered 2011-09-17: 300 ug via SUBCUTANEOUS
  Filled 2011-09-17: qty 0.6

## 2011-10-07 ENCOUNTER — Other Ambulatory Visit (HOSPITAL_BASED_OUTPATIENT_CLINIC_OR_DEPARTMENT_OTHER): Payer: Medicare Other | Admitting: Lab

## 2011-10-07 ENCOUNTER — Ambulatory Visit (HOSPITAL_BASED_OUTPATIENT_CLINIC_OR_DEPARTMENT_OTHER): Payer: Medicare Other

## 2011-10-07 VITALS — BP 121/70 | HR 75 | Temp 96.7°F

## 2011-10-07 DIAGNOSIS — D469 Myelodysplastic syndrome, unspecified: Secondary | ICD-10-CM

## 2011-10-07 DIAGNOSIS — D4620 Refractory anemia with excess of blasts, unspecified: Secondary | ICD-10-CM

## 2011-10-07 LAB — CBC WITH DIFFERENTIAL/PLATELET
Basophils Absolute: 0 10*3/uL (ref 0.0–0.1)
EOS%: 1.5 % (ref 0.0–7.0)
HCT: 28.5 % — ABNORMAL LOW (ref 38.4–49.9)
HGB: 9.2 g/dL — ABNORMAL LOW (ref 13.0–17.1)
LYMPH%: 40.1 % (ref 14.0–49.0)
MCH: 31.1 pg (ref 27.2–33.4)
MCHC: 32.3 g/dL (ref 32.0–36.0)
MCV: 96.3 fL (ref 79.3–98.0)
MONO%: 4.1 % (ref 0.0–14.0)
NEUT%: 54.3 % (ref 39.0–75.0)
Platelets: 37 10*3/uL — ABNORMAL LOW (ref 140–400)

## 2011-10-07 LAB — MORPHOLOGY: PLT EST: DECREASED

## 2011-10-07 MED ORDER — DARBEPOETIN ALFA-POLYSORBATE 300 MCG/0.6ML IJ SOLN
300.0000 ug | Freq: Once | INTRAMUSCULAR | Status: AC
  Administered 2011-10-07: 300 ug via SUBCUTANEOUS
  Filled 2011-10-07: qty 0.6

## 2011-10-08 ENCOUNTER — Other Ambulatory Visit: Payer: Medicare Other | Admitting: Lab

## 2011-10-08 ENCOUNTER — Ambulatory Visit: Payer: Medicare Other

## 2011-10-28 ENCOUNTER — Ambulatory Visit (HOSPITAL_BASED_OUTPATIENT_CLINIC_OR_DEPARTMENT_OTHER): Payer: Medicare Other

## 2011-10-28 ENCOUNTER — Other Ambulatory Visit (HOSPITAL_BASED_OUTPATIENT_CLINIC_OR_DEPARTMENT_OTHER): Payer: Medicare Other

## 2011-10-28 VITALS — BP 114/61 | HR 80 | Temp 96.8°F

## 2011-10-28 DIAGNOSIS — D4620 Refractory anemia with excess of blasts, unspecified: Secondary | ICD-10-CM

## 2011-10-28 DIAGNOSIS — D469 Myelodysplastic syndrome, unspecified: Secondary | ICD-10-CM

## 2011-10-28 LAB — CBC WITH DIFFERENTIAL/PLATELET
Eosinophils Absolute: 0 10*3/uL (ref 0.0–0.5)
LYMPH%: 38.6 % (ref 14.0–49.0)
MONO#: 0.1 10*3/uL (ref 0.1–0.9)
NEUT#: 1.6 10*3/uL (ref 1.5–6.5)
Platelets: 41 10*3/uL — ABNORMAL LOW (ref 140–400)
RBC: 3.07 10*6/uL — ABNORMAL LOW (ref 4.20–5.82)
WBC: 2.7 10*3/uL — ABNORMAL LOW (ref 4.0–10.3)
lymph#: 1.1 10*3/uL (ref 0.9–3.3)
nRBC: 0 % (ref 0–0)

## 2011-10-28 LAB — MORPHOLOGY: PLT EST: DECREASED

## 2011-10-28 MED ORDER — DARBEPOETIN ALFA-POLYSORBATE 300 MCG/0.6ML IJ SOLN
300.0000 ug | Freq: Once | INTRAMUSCULAR | Status: AC
  Administered 2011-10-28: 300 ug via SUBCUTANEOUS
  Filled 2011-10-28: qty 0.6

## 2011-10-29 ENCOUNTER — Telehealth: Payer: Self-pay | Admitting: *Deleted

## 2011-10-29 ENCOUNTER — Ambulatory Visit: Payer: Medicare Other

## 2011-10-29 ENCOUNTER — Other Ambulatory Visit: Payer: Medicare Other | Admitting: Lab

## 2011-10-29 NOTE — Telephone Encounter (Signed)
Message copied by Gala Romney on Fri Oct 29, 2011  4:52 PM ------      Message from: [name]      Created: Fri Oct 29, 2011  2:47 PM       Call pt - counts stable c/w previous

## 2011-11-01 ENCOUNTER — Telehealth: Payer: Self-pay | Admitting: *Deleted

## 2011-11-01 NOTE — Telephone Encounter (Signed)
Message copied by Gala Romney on Mon Nov 01, 2011 11:53 AM ------      Message from: [name]      Created: Caleen Essex Oct 29, 2011  2:47 PM       Call pt - counts stable c/w previous

## 2011-11-01 NOTE — Telephone Encounter (Signed)
Reached pt today and message given per Dr. Cyndie Chime regarding labs.  Pt verbalized understanding.

## 2011-11-11 DIAGNOSIS — D485 Neoplasm of uncertain behavior of skin: Secondary | ICD-10-CM

## 2011-11-11 HISTORY — DX: Neoplasm of uncertain behavior of skin: D48.5

## 2011-11-18 ENCOUNTER — Ambulatory Visit (HOSPITAL_BASED_OUTPATIENT_CLINIC_OR_DEPARTMENT_OTHER): Payer: Medicare Other

## 2011-11-18 ENCOUNTER — Other Ambulatory Visit (HOSPITAL_BASED_OUTPATIENT_CLINIC_OR_DEPARTMENT_OTHER): Payer: Medicare Other | Admitting: Lab

## 2011-11-18 ENCOUNTER — Other Ambulatory Visit: Payer: Self-pay | Admitting: Oncology

## 2011-11-18 DIAGNOSIS — D4620 Refractory anemia with excess of blasts, unspecified: Secondary | ICD-10-CM

## 2011-11-18 DIAGNOSIS — D469 Myelodysplastic syndrome, unspecified: Secondary | ICD-10-CM

## 2011-11-18 LAB — CBC WITH DIFFERENTIAL/PLATELET
BASO%: 0.4 % (ref 0.0–2.0)
Basophils Absolute: 0 10*3/uL (ref 0.0–0.1)
EOS%: 0.9 % (ref 0.0–7.0)
HCT: 29 % — ABNORMAL LOW (ref 38.4–49.9)
LYMPH%: 49.8 % — ABNORMAL HIGH (ref 14.0–49.0)
MCH: 31.1 pg (ref 27.2–33.4)
MCHC: 32.1 g/dL (ref 32.0–36.0)
MCV: 97 fL (ref 79.3–98.0)
MONO%: 3.4 % (ref 0.0–14.0)
NEUT%: 45.5 % (ref 39.0–75.0)
Platelets: 42 10*3/uL — ABNORMAL LOW (ref 140–400)

## 2011-11-18 MED ORDER — DARBEPOETIN ALFA-POLYSORBATE 300 MCG/0.6ML IJ SOLN
300.0000 ug | Freq: Once | INTRAMUSCULAR | Status: AC
  Administered 2011-11-18: 300 ug via SUBCUTANEOUS
  Filled 2011-11-18: qty 0.6

## 2011-11-18 NOTE — Patient Instructions (Signed)
Call MD for problems 

## 2011-11-19 ENCOUNTER — Other Ambulatory Visit: Payer: Medicare Other | Admitting: Lab

## 2011-11-19 ENCOUNTER — Ambulatory Visit: Payer: Medicare Other

## 2011-12-09 ENCOUNTER — Ambulatory Visit (HOSPITAL_BASED_OUTPATIENT_CLINIC_OR_DEPARTMENT_OTHER): Payer: Medicare Other

## 2011-12-09 ENCOUNTER — Other Ambulatory Visit (HOSPITAL_BASED_OUTPATIENT_CLINIC_OR_DEPARTMENT_OTHER): Payer: Medicare Other | Admitting: Lab

## 2011-12-09 VITALS — BP 119/59 | HR 76 | Temp 96.8°F

## 2011-12-09 DIAGNOSIS — D469 Myelodysplastic syndrome, unspecified: Secondary | ICD-10-CM

## 2011-12-09 DIAGNOSIS — D4620 Refractory anemia with excess of blasts, unspecified: Secondary | ICD-10-CM

## 2011-12-09 LAB — CBC WITH DIFFERENTIAL/PLATELET
BASO%: 0.4 % (ref 0.0–2.0)
Eosinophils Absolute: 0 10*3/uL (ref 0.0–0.5)
LYMPH%: 39.9 % (ref 14.0–49.0)
MCHC: 31.2 g/dL — ABNORMAL LOW (ref 32.0–36.0)
MONO#: 0.1 10*3/uL (ref 0.1–0.9)
MONO%: 3.8 % (ref 0.0–14.0)
NEUT#: 1.5 10*3/uL (ref 1.5–6.5)
RBC: 3.02 10*6/uL — ABNORMAL LOW (ref 4.20–5.82)
RDW: 19.5 % — ABNORMAL HIGH (ref 11.0–14.6)
WBC: 2.6 10*3/uL — ABNORMAL LOW (ref 4.0–10.3)
nRBC: 0 % (ref 0–0)

## 2011-12-09 LAB — MORPHOLOGY: PLT EST: DECREASED

## 2011-12-09 MED ORDER — DARBEPOETIN ALFA-POLYSORBATE 300 MCG/0.6ML IJ SOLN
300.0000 ug | Freq: Once | INTRAMUSCULAR | Status: AC
  Administered 2011-12-09: 300 ug via SUBCUTANEOUS
  Filled 2011-12-09: qty 0.6

## 2011-12-10 ENCOUNTER — Ambulatory Visit: Payer: Medicare Other

## 2011-12-10 ENCOUNTER — Other Ambulatory Visit: Payer: Medicare Other | Admitting: Lab

## 2011-12-27 ENCOUNTER — Ambulatory Visit (HOSPITAL_BASED_OUTPATIENT_CLINIC_OR_DEPARTMENT_OTHER): Payer: Medicare Other | Admitting: Nurse Practitioner

## 2011-12-27 ENCOUNTER — Telehealth: Payer: Self-pay | Admitting: Oncology

## 2011-12-27 ENCOUNTER — Other Ambulatory Visit (HOSPITAL_BASED_OUTPATIENT_CLINIC_OR_DEPARTMENT_OTHER): Payer: Medicare Other | Admitting: Lab

## 2011-12-27 VITALS — BP 111/53 | HR 81 | Temp 97.3°F | Resp 18 | Ht 68.0 in | Wt 149.3 lb

## 2011-12-27 DIAGNOSIS — D469 Myelodysplastic syndrome, unspecified: Secondary | ICD-10-CM

## 2011-12-27 DIAGNOSIS — D4620 Refractory anemia with excess of blasts, unspecified: Secondary | ICD-10-CM

## 2011-12-27 DIAGNOSIS — I10 Essential (primary) hypertension: Secondary | ICD-10-CM

## 2011-12-27 DIAGNOSIS — D510 Vitamin B12 deficiency anemia due to intrinsic factor deficiency: Secondary | ICD-10-CM

## 2011-12-27 LAB — CBC WITH DIFFERENTIAL/PLATELET
Eosinophils Absolute: 0 10*3/uL (ref 0.0–0.5)
HCT: 30.8 % — ABNORMAL LOW (ref 38.4–49.9)
HGB: 9.7 g/dL — ABNORMAL LOW (ref 13.0–17.1)
MONO#: 0 10*3/uL — ABNORMAL LOW (ref 0.1–0.9)
MONO%: 0.4 % (ref 0.0–14.0)
Platelets: 42 10*3/uL — ABNORMAL LOW (ref 140–400)
RBC: 3.14 10*6/uL — ABNORMAL LOW (ref 4.20–5.82)
RDW: 19.7 % — ABNORMAL HIGH (ref 11.0–14.6)
lymph#: 0.9 10*3/uL (ref 0.9–3.3)
nRBC: 0 % (ref 0–0)

## 2011-12-27 LAB — MORPHOLOGY

## 2011-12-27 MED ORDER — DARBEPOETIN ALFA-POLYSORBATE 300 MCG/0.6ML IJ SOLN
300.0000 ug | Freq: Once | INTRAMUSCULAR | Status: AC
  Administered 2011-12-27: 300 ug via SUBCUTANEOUS
  Filled 2011-12-27: qty 0.6

## 2011-12-27 NOTE — Telephone Encounter (Signed)
appts made and printed for pt aom °

## 2011-12-27 NOTE — Progress Notes (Signed)
OFFICE PROGRESS NOTE  Interval history:  [name] is a 78 year old man with MDS. He presented with pancytopenia in March 2012. He was initially transfusion dependent for red cells. Bone marrow aspiration and biopsy 06/04/2010 showed dysplastic changes in all 3 cell lines. Chromosome studies were normal. There were no ringed sideroblasts. Aranesp injections were initiated. He has been transfusion independent since beginning Aranesp. The injections are currently being administered on a 3 week schedule. White count overall has been stable. Platelet count has been running in the 40,000 range fairly consistently over the past 3 months. He is seen today for scheduled followup.  [name] denies interim illnesses or infections. He is scheduled for an influenza vaccination on 01/05/2012. He overall is feeling well. He denies bleeding. No fevers. No shortness of breath. Bowels moving regularly overall.   Objective: Blood pressure 111/53, pulse 81, temperature 97.3 F (36.3 C), temperature source Oral, resp. rate 18, height 5\' 8"  (1.727 m), weight 149 lb 4.8 oz (67.722 kg).  Oropharynx is without thrush or ulceration. No palpable cervical or supraclavicular lymph nodes. Lungs are clear. Regular cardiac rhythm. Abdomen is soft and nontender. No organomegaly. Extremities are without edema.  Lab Results: Lab Results  Component Value Date   WBC 2.3* 12/27/2011   HGB 9.7* 12/27/2011   HCT 30.8* 12/27/2011   MCV 98.1* 12/27/2011   PLT 42* 12/27/2011    Chemistry:    Chemistry      Component Value Date/Time   NA 139 05/10/2011 1246   K 4.7 05/10/2011 1246   CL 106 05/10/2011 1246   CO2 24 05/10/2011 1246   BUN 31* 05/10/2011 1246   CREATININE 1.21 05/10/2011 1246      Component Value Date/Time   CALCIUM 8.8 05/10/2011 1246   ALKPHOS 81 05/10/2011 1246   AST 20 05/10/2011 1246   ALT 15 05/10/2011 1246   BILITOT 0.7 05/10/2011 1246       Studies/Results: No results found.  Medications: He did not bring  an active medication list to today's appointment.  Assessment/Plan:  1. Myelodysplastic syndrome. Hemoglobin remains stable on every three-week Aranesp injections. 2. Hypothyroid on replacement. 3. Pernicious anemia. He receives monthly B12 injections. 4. BPH. 5. Degenerative arthritis.  Disposition-plan to continue every three-week Aranesp injections. He will return for a followup visit in 4 months. He will contact the office in the interim with any problems.  Plan reviewed with Dr. Cyndie Chime.  Lonna Cobb ANP/GNP-BC   CC Dr. Murray Hodgkins and Dr. Rollene Rotunda

## 2012-01-17 ENCOUNTER — Other Ambulatory Visit (HOSPITAL_BASED_OUTPATIENT_CLINIC_OR_DEPARTMENT_OTHER): Payer: Medicare Other | Admitting: Lab

## 2012-01-17 ENCOUNTER — Ambulatory Visit (HOSPITAL_BASED_OUTPATIENT_CLINIC_OR_DEPARTMENT_OTHER): Payer: Medicare Other

## 2012-01-17 VITALS — BP 110/61 | HR 78 | Temp 96.7°F

## 2012-01-17 DIAGNOSIS — D469 Myelodysplastic syndrome, unspecified: Secondary | ICD-10-CM

## 2012-01-17 DIAGNOSIS — D4620 Refractory anemia with excess of blasts, unspecified: Secondary | ICD-10-CM

## 2012-01-17 LAB — CBC WITH DIFFERENTIAL/PLATELET
Eosinophils Absolute: 0 10*3/uL (ref 0.0–0.5)
HCT: 29.4 % — ABNORMAL LOW (ref 38.4–49.9)
LYMPH%: 33.4 % (ref 14.0–49.0)
MCV: 98 fL (ref 79.3–98.0)
MONO%: 4 % (ref 0.0–14.0)
NEUT#: 1.8 10*3/uL (ref 1.5–6.5)
NEUT%: 61 % (ref 39.0–75.0)
Platelets: 66 10*3/uL — ABNORMAL LOW (ref 140–400)
RBC: 3 10*6/uL — ABNORMAL LOW (ref 4.20–5.82)
nRBC: 0 % (ref 0–0)

## 2012-01-17 LAB — MORPHOLOGY: PLT EST: DECREASED

## 2012-01-17 MED ORDER — DARBEPOETIN ALFA-POLYSORBATE 300 MCG/0.6ML IJ SOLN
300.0000 ug | Freq: Once | INTRAMUSCULAR | Status: AC
  Administered 2012-01-17: 300 ug via SUBCUTANEOUS
  Filled 2012-01-17: qty 0.6

## 2012-02-07 ENCOUNTER — Ambulatory Visit (HOSPITAL_BASED_OUTPATIENT_CLINIC_OR_DEPARTMENT_OTHER): Payer: Medicare Other

## 2012-02-07 ENCOUNTER — Other Ambulatory Visit (HOSPITAL_BASED_OUTPATIENT_CLINIC_OR_DEPARTMENT_OTHER): Payer: Medicare Other | Admitting: Lab

## 2012-02-07 VITALS — BP 126/67 | HR 86 | Temp 96.9°F

## 2012-02-07 DIAGNOSIS — D469 Myelodysplastic syndrome, unspecified: Secondary | ICD-10-CM

## 2012-02-07 LAB — CBC WITH DIFFERENTIAL/PLATELET
BASO%: 0.3 % (ref 0.0–2.0)
Basophils Absolute: 0 10*3/uL (ref 0.0–0.1)
EOS%: 1.7 % (ref 0.0–7.0)
Eosinophils Absolute: 0.1 10*3/uL (ref 0.0–0.5)
HCT: 31 % — ABNORMAL LOW (ref 38.4–49.9)
HGB: 9.9 g/dL — ABNORMAL LOW (ref 13.0–17.1)
LYMPH%: 39.3 % (ref 14.0–49.0)
MCH: 30.7 pg (ref 27.2–33.4)
MCHC: 31.9 g/dL — ABNORMAL LOW (ref 32.0–36.0)
MCV: 96.3 fL (ref 79.3–98.0)
MONO#: 0.1 10*3/uL (ref 0.1–0.9)
MONO%: 2.8 % (ref 0.0–14.0)
NEUT#: 1.6 10*3/uL (ref 1.5–6.5)
NEUT%: 55.9 % (ref 39.0–75.0)
Platelets: 48 10*3/uL — ABNORMAL LOW (ref 140–400)
RBC: 3.22 10*6/uL — ABNORMAL LOW (ref 4.20–5.82)
RDW: 19 % — ABNORMAL HIGH (ref 11.0–14.6)
WBC: 2.9 10*3/uL — ABNORMAL LOW (ref 4.0–10.3)
lymph#: 1.1 10*3/uL (ref 0.9–3.3)
nRBC: 0 % (ref 0–0)

## 2012-02-07 LAB — MORPHOLOGY: PLT EST: DECREASED

## 2012-02-07 MED ORDER — DARBEPOETIN ALFA-POLYSORBATE 300 MCG/0.6ML IJ SOLN
300.0000 ug | Freq: Once | INTRAMUSCULAR | Status: AC
  Administered 2012-02-07: 300 ug via SUBCUTANEOUS
  Filled 2012-02-07: qty 0.6

## 2012-02-28 ENCOUNTER — Other Ambulatory Visit (HOSPITAL_BASED_OUTPATIENT_CLINIC_OR_DEPARTMENT_OTHER): Payer: Medicare Other | Admitting: Lab

## 2012-02-28 ENCOUNTER — Ambulatory Visit (HOSPITAL_BASED_OUTPATIENT_CLINIC_OR_DEPARTMENT_OTHER): Payer: Medicare Other

## 2012-02-28 VITALS — BP 117/63 | HR 81 | Temp 96.8°F

## 2012-02-28 DIAGNOSIS — D469 Myelodysplastic syndrome, unspecified: Secondary | ICD-10-CM

## 2012-02-28 LAB — CBC WITH DIFFERENTIAL/PLATELET
BASO%: 0.4 % (ref 0.0–2.0)
Basophils Absolute: 0 10*3/uL (ref 0.0–0.1)
EOS%: 1.1 % (ref 0.0–7.0)
HGB: 9.5 g/dL — ABNORMAL LOW (ref 13.0–17.1)
MCH: 32.4 pg (ref 27.2–33.4)
MCHC: 33.1 g/dL (ref 32.0–36.0)
MCV: 98.1 fL — ABNORMAL HIGH (ref 79.3–98.0)
MONO%: 4.9 % (ref 0.0–14.0)
NEUT%: 48.9 % (ref 39.0–75.0)
RDW: 20 % — ABNORMAL HIGH (ref 11.0–14.6)
lymph#: 1.1 10*3/uL (ref 0.9–3.3)

## 2012-02-28 MED ORDER — DARBEPOETIN ALFA-POLYSORBATE 300 MCG/0.6ML IJ SOLN
300.0000 ug | Freq: Once | INTRAMUSCULAR | Status: AC
  Administered 2012-02-28: 300 ug via SUBCUTANEOUS
  Filled 2012-02-28: qty 0.6

## 2012-03-20 ENCOUNTER — Ambulatory Visit (HOSPITAL_BASED_OUTPATIENT_CLINIC_OR_DEPARTMENT_OTHER): Payer: Medicare Other

## 2012-03-20 ENCOUNTER — Other Ambulatory Visit (HOSPITAL_BASED_OUTPATIENT_CLINIC_OR_DEPARTMENT_OTHER): Payer: Medicare Other | Admitting: Lab

## 2012-03-20 VITALS — BP 115/64 | HR 80 | Temp 96.8°F

## 2012-03-20 DIAGNOSIS — D469 Myelodysplastic syndrome, unspecified: Secondary | ICD-10-CM

## 2012-03-20 LAB — CBC WITH DIFFERENTIAL/PLATELET
Basophils Absolute: 0 10*3/uL (ref 0.0–0.1)
EOS%: 0.5 % (ref 0.0–7.0)
HGB: 9.1 g/dL — ABNORMAL LOW (ref 13.0–17.1)
MCH: 30.3 pg (ref 27.2–33.4)
MCV: 96.7 fL (ref 79.3–98.0)
MONO%: 2.4 % (ref 0.0–14.0)
NEUT#: 1.1 10*3/uL — ABNORMAL LOW (ref 1.5–6.5)
RBC: 3 10*6/uL — ABNORMAL LOW (ref 4.20–5.82)
RDW: 19.3 % — ABNORMAL HIGH (ref 11.0–14.6)
lymph#: 0.9 10*3/uL (ref 0.9–3.3)
nRBC: 0 % (ref 0–0)

## 2012-03-20 LAB — MORPHOLOGY

## 2012-03-20 MED ORDER — DARBEPOETIN ALFA-POLYSORBATE 300 MCG/0.6ML IJ SOLN
300.0000 ug | Freq: Once | INTRAMUSCULAR | Status: AC
  Administered 2012-03-20: 300 ug via SUBCUTANEOUS
  Filled 2012-03-20: qty 0.6

## 2012-03-20 NOTE — Patient Instructions (Signed)
Call MD for shortness of breath or increasing fatigue 

## 2012-03-30 DIAGNOSIS — R351 Nocturia: Secondary | ICD-10-CM

## 2012-03-30 HISTORY — DX: Nocturia: R35.1

## 2012-04-10 ENCOUNTER — Ambulatory Visit (HOSPITAL_BASED_OUTPATIENT_CLINIC_OR_DEPARTMENT_OTHER): Payer: Medicare Other

## 2012-04-10 ENCOUNTER — Other Ambulatory Visit (HOSPITAL_BASED_OUTPATIENT_CLINIC_OR_DEPARTMENT_OTHER): Payer: Medicare Other | Admitting: Lab

## 2012-04-10 VITALS — BP 123/68 | HR 80 | Temp 96.7°F

## 2012-04-10 DIAGNOSIS — D469 Myelodysplastic syndrome, unspecified: Secondary | ICD-10-CM

## 2012-04-10 LAB — CBC WITH DIFFERENTIAL/PLATELET
Basophils Absolute: 0 10*3/uL (ref 0.0–0.1)
EOS%: 0.7 % (ref 0.0–7.0)
HCT: 30.6 % — ABNORMAL LOW (ref 38.4–49.9)
HGB: 9.6 g/dL — ABNORMAL LOW (ref 13.0–17.1)
MCH: 30.1 pg (ref 27.2–33.4)
NEUT%: 55.8 % (ref 39.0–75.0)
lymph#: 1.1 10*3/uL (ref 0.9–3.3)

## 2012-04-10 MED ORDER — DARBEPOETIN ALFA-POLYSORBATE 300 MCG/0.6ML IJ SOLN
300.0000 ug | Freq: Once | INTRAMUSCULAR | Status: AC
  Administered 2012-04-10: 300 ug via SUBCUTANEOUS
  Filled 2012-04-10: qty 0.6

## 2012-04-20 ENCOUNTER — Telehealth: Payer: Self-pay | Admitting: *Deleted

## 2012-04-20 NOTE — Telephone Encounter (Signed)
Received call from pt stating that he received automated call that he has an appt Monday but he doesn't think this is right & should be 2/10 b/c his injections are q 3 wks.  This was verified & message left for scheduler/Rose to r/s & call pt.

## 2012-04-24 ENCOUNTER — Ambulatory Visit: Payer: Medicare Other

## 2012-04-24 ENCOUNTER — Other Ambulatory Visit: Payer: Medicare Other | Admitting: Lab

## 2012-05-01 ENCOUNTER — Ambulatory Visit (HOSPITAL_BASED_OUTPATIENT_CLINIC_OR_DEPARTMENT_OTHER): Payer: Medicare Other

## 2012-05-01 ENCOUNTER — Other Ambulatory Visit (HOSPITAL_BASED_OUTPATIENT_CLINIC_OR_DEPARTMENT_OTHER): Payer: Medicare Other | Admitting: Lab

## 2012-05-01 ENCOUNTER — Telehealth: Payer: Self-pay | Admitting: Oncology

## 2012-05-01 VITALS — BP 109/51 | HR 76 | Temp 96.8°F

## 2012-05-01 DIAGNOSIS — D469 Myelodysplastic syndrome, unspecified: Secondary | ICD-10-CM

## 2012-05-01 LAB — CBC WITH DIFFERENTIAL/PLATELET
Basophils Absolute: 0 10*3/uL (ref 0.0–0.1)
Eosinophils Absolute: 0 10*3/uL (ref 0.0–0.5)
HGB: 9.4 g/dL — ABNORMAL LOW (ref 13.0–17.1)
LYMPH%: 34.4 % (ref 14.0–49.0)
MCV: 96.5 fL (ref 79.3–98.0)
MONO%: 2.5 % (ref 0.0–14.0)
NEUT#: 1.7 10*3/uL (ref 1.5–6.5)
NEUT%: 61.7 % (ref 39.0–75.0)
Platelets: 47 10*3/uL — ABNORMAL LOW (ref 140–400)
RBC: 3.12 10*6/uL — ABNORMAL LOW (ref 4.20–5.82)

## 2012-05-01 LAB — MORPHOLOGY: PLT EST: DECREASED

## 2012-05-01 MED ORDER — DARBEPOETIN ALFA-POLYSORBATE 300 MCG/0.6ML IJ SOLN
300.0000 ug | Freq: Once | INTRAMUSCULAR | Status: AC
  Administered 2012-05-01: 300 ug via SUBCUTANEOUS
  Filled 2012-05-01: qty 0.6

## 2012-05-01 NOTE — Telephone Encounter (Signed)
Pt came by  today and got his calendar for 05/15/12 lab, MD and injection

## 2012-05-15 ENCOUNTER — Ambulatory Visit (HOSPITAL_BASED_OUTPATIENT_CLINIC_OR_DEPARTMENT_OTHER): Payer: Medicare Other

## 2012-05-15 ENCOUNTER — Other Ambulatory Visit (HOSPITAL_BASED_OUTPATIENT_CLINIC_OR_DEPARTMENT_OTHER): Payer: Medicare Other | Admitting: Lab

## 2012-05-15 ENCOUNTER — Telehealth: Payer: Self-pay | Admitting: Oncology

## 2012-05-15 ENCOUNTER — Ambulatory Visit (HOSPITAL_BASED_OUTPATIENT_CLINIC_OR_DEPARTMENT_OTHER): Payer: Medicare Other | Admitting: Oncology

## 2012-05-15 VITALS — BP 134/75 | HR 90 | Temp 96.7°F | Resp 20 | Ht 68.0 in | Wt 148.8 lb

## 2012-05-15 DIAGNOSIS — D469 Myelodysplastic syndrome, unspecified: Secondary | ICD-10-CM

## 2012-05-15 DIAGNOSIS — D510 Vitamin B12 deficiency anemia due to intrinsic factor deficiency: Secondary | ICD-10-CM

## 2012-05-15 LAB — CBC WITH DIFFERENTIAL/PLATELET
BASO%: 0 % (ref 0.0–2.0)
MCHC: 31.5 g/dL — ABNORMAL LOW (ref 32.0–36.0)
MONO#: 0.1 10*3/uL (ref 0.1–0.9)
RBC: 3.26 10*6/uL — ABNORMAL LOW (ref 4.20–5.82)
WBC: 2.7 10*3/uL — ABNORMAL LOW (ref 4.0–10.3)
lymph#: 1 10*3/uL (ref 0.9–3.3)
nRBC: 0 % (ref 0–0)

## 2012-05-15 MED ORDER — DARBEPOETIN ALFA-POLYSORBATE 300 MCG/0.6ML IJ SOLN
300.0000 ug | Freq: Once | INTRAMUSCULAR | Status: AC
  Administered 2012-05-15: 300 ug via SUBCUTANEOUS
  Filled 2012-05-15: qty 0.6

## 2012-05-15 NOTE — Telephone Encounter (Signed)
gv and printed appt schedule for pt for March thru Aug.

## 2012-05-15 NOTE — Progress Notes (Signed)
Hematology and Oncology Follow Up Visit  Dwayne Stuart [id number] [birth year] 79 y.o. 05/15/2012 2:55 PM   Principle Diagnosis: Encounter Diagnoses  Name Primary?  . Myelodysplastic syndrome Yes  . Pernicious anemia      Interim History:    Medications: reviewed  Allergies: No Known Allergies  Review of Systems: Constitutional:    Respiratory: Cardiovascular:   Gastrointestinal: Genito-Urinary:  Musculoskeletal: Neurologic: Skin: Remaining ROS negative.  Physical Exam: There were no vitals taken for this visit. Wt Readings from Last 3 Encounters:  12/27/11 149 lb 4.8 oz (67.722 kg)  08/27/11 148 lb 14.4 oz (67.541 kg)  05/31/11 148 lb 11.2 oz (67.45 kg)     General appearance:  HENNT:  Lymph nodes:  Breasts: Lungs: Heart: Abdomen: Extremities: Vascular: Neurologic: Skin:  Lab Results: Lab Results  Component Value Date   WBC 2.7* 05/15/2012   HGB 9.9* 05/15/2012   HCT 31.4* 05/15/2012   MCV 96.3 05/15/2012   PLT 41* 05/15/2012     Chemistry      Component Value Date/Time   NA 139 05/10/2011 1246   K 4.7 05/10/2011 1246   CL 106 05/10/2011 1246   CO2 24 05/10/2011 1246   BUN 31* 05/10/2011 1246   CREATININE 1.21 05/10/2011 1246      Component Value Date/Time   CALCIUM 8.8 05/10/2011 1246   ALKPHOS 81 05/10/2011 1246   AST 20 05/10/2011 1246   ALT 15 05/10/2011 1246   BILITOT 0.7 05/10/2011 1246       Radiological Studies: No results found.  Impression and Plan:   CC:Marland Kitchen    [name], MD 2/24/20142:55 PM

## 2012-05-15 NOTE — Patient Instructions (Signed)
continue injections of aranesp every 3 weeks to boost red blood cell count next 3/17 Follow up visit with nurse practitioner 1:45 PM on 09/18/12

## 2012-05-15 NOTE — Progress Notes (Signed)
Hematology and Oncology Follow Up Visit  Dwayne Stuart [id number] 1914-05-03 79 y.o. 05/15/2012 2:56 PM   Principle Diagnosis: Encounter Diagnoses  Name Primary?  . Myelodysplastic syndrome Yes  . Pernicious anemia      Interim History:    Medications: reviewed  Allergies: No Known Allergies  Review of Systems: Constitutional:    Respiratory: Cardiovascular:   Gastrointestinal: Genito-Urinary:  Musculoskeletal: Neurologic: Skin: Remaining ROS negative.  Physical Exam: There were no vitals taken for this visit. Wt Readings from Last 3 Encounters:  12/27/11 149 lb 4.8 oz (67.722 kg)  08/27/11 148 lb 14.4 oz (67.541 kg)  05/31/11 148 lb 11.2 oz (67.45 kg)     General appearance:  HENNT:  Lymph nodes:  Breasts: Lungs: Heart: Abdomen: Extremities: Vascular: Neurologic: Skin:  Lab Results: Lab Results  Component Value Date   WBC 2.7* 05/15/2012   HGB 9.9* 05/15/2012   HCT 31.4* 05/15/2012   MCV 96.3 05/15/2012   PLT 41* 05/15/2012     Chemistry      Component Value Date/Time   NA 139 05/10/2011 1246   K 4.7 05/10/2011 1246   CL 106 05/10/2011 1246   CO2 24 05/10/2011 1246   BUN 31* 05/10/2011 1246   CREATININE 1.21 05/10/2011 1246      Component Value Date/Time   CALCIUM 8.8 05/10/2011 1246   ALKPHOS 81 05/10/2011 1246   AST 20 05/10/2011 1246   ALT 15 05/10/2011 1246   BILITOT 0.7 05/10/2011 1246       Radiological Studies: No results found.  Impression and Plan:   CC:Marland Kitchen    [name], MD 2/24/20142:56 PM

## 2012-05-15 NOTE — Progress Notes (Signed)
Orders only encounter

## 2012-05-15 NOTE — Addendum Note (Signed)
Addended by: [name] on: 05/15/2012 02:57 PM   Modules accepted: Level of Service

## 2012-05-16 NOTE — Progress Notes (Signed)
Hematology and Oncology Follow Up Visit  Dwayne Stuart [id number] [birth year] 79 y.o. 05/16/2012 7:43 AM   Principle Diagnosis: Encounter Diagnoses  Name Primary?  . Myelodysplastic syndrome Yes  . Pernicious anemia      Interim History:   Followup visit for this pleasant 79 year old man diagnosed with a myelodysplastic syndrome presenting with pancytopenia with disproportionate anemia in March of 2012. History at that point was that he was felt to have B12 deficiency but despite replacement had been getting intermittent transfusions for about 3 years. At time of my initial consultation on 06/03/2010 he presented with a hemoglobin of 5.5, white count 3000, platelets as low as 43,000. Bone marrow aspiration and biopsy done 06/04/2010 showed dysplastic changes in all 3 cell lines. FISH studies for the 5Q deletion showed that this was not present. There were no ring sideroblasts. Ferritin was disproportionately high in relation to his anemia at 394. Baseline erythropoietin 79. He was started on a course of Aranesp. He has been receiving 300 mcg every 3 weeks and we have been able to keep his hemoglobin above 9 g. He has had a sustained response now for 2 years.  He is in an assisted living facility. He is accompanied by his son today. He is doing remarkably well despite his age and has had no interim major medical problems. He has difficulty ambulating due to  significant arthritis.His life is primarily sedentary. He reports no dyspnea or chest pain at rest. Appetite is good. Weight remains stable at 149 pounds.  Medications: reviewed  Allergies: No Known Allergies  Review of Systems: Constitutional:   No constitutional symptoms Respiratory: See above Cardiovascular:  See above Gastrointestinal: Regular bowel habit Genito-Urinary: Nocturia x3 Musculoskeletal: See above Neurologic: No headache or change in vision Skin: No rash or ecchymosis Remaining ROS negative.  Physical Exam: There  were no vitals taken for this visit. Wt Readings from Last 3 Encounters:  05/15/12 148 lb 12.8 oz (67.495 kg)  12/27/11 149 lb 4.8 oz (67.722 kg)  08/27/11 148 lb 14.4 oz (67.541 kg)     General appearance: Frail elderly man HENNT: Pharynx no erythema or exudate Lymph nodes: No adenopathy Breasts: Lungs: Clear to auscultation resonant to percussion Heart: Regular rhythm. 1-2/6 systolic murmur second right intercostal space Abdomen: Soft, nontender, no mass, no organomegaly Extremities: No edema, no calf tenderness Vascular: No cyanosis Neurologic: He is hard of hearing. Motor strength is 5 over 5. Reflexes 1+ symmetric Skin: No rash or ecchymoses  Lab Results: Lab Results: White count differential: 57% neutrophils, 35% lymphocytes, 6% monocytes   Component Value Date   WBC 2.7* 05/15/2012   HGB 9.9* 05/15/2012   HCT 31.4* 05/15/2012   MCV 96.3 05/15/2012   PLT 41* 05/15/2012     Chemistry      Component Value Date/Time   NA 139 05/10/2011 1246   K 4.7 05/10/2011 1246   CL 106 05/10/2011 1246   CO2 24 05/10/2011 1246   BUN 31* 05/10/2011 1246   CREATININE 1.21 05/10/2011 1246      Component Value Date/Time   CALCIUM 8.8 05/10/2011 1246   ALKPHOS 81 05/10/2011 1246   AST 20 05/10/2011 1246   ALT 15 05/10/2011 1246   BILITOT 0.7 05/10/2011 1246       Radiological Studies: No results found.  Impression and Plan: #1. Intermediate risk myelodysplastic syndrome. Durable response to hematopoietic growth factor Plan: Continue the same  #2. Hypothyroid on replacement.  #3. Pernicious anemia on monthly B12 injections  #  4. Benign prostatic hypertrophy likely etiology of his nocturia he continues on Flomax.  #5. Degenerative arthritis   CC:. Dr. Murray Hodgkins. Dr. Rollene Rotunda   [name], MD 2/25/20147:43 AM

## 2012-05-25 DIAGNOSIS — Z9181 History of falling: Secondary | ICD-10-CM

## 2012-05-25 DIAGNOSIS — R7989 Other specified abnormal findings of blood chemistry: Secondary | ICD-10-CM

## 2012-05-25 HISTORY — DX: History of falling: Z91.81

## 2012-05-25 HISTORY — DX: Other specified abnormal findings of blood chemistry: R79.89

## 2012-06-05 ENCOUNTER — Ambulatory Visit (HOSPITAL_BASED_OUTPATIENT_CLINIC_OR_DEPARTMENT_OTHER): Payer: Medicare Other

## 2012-06-05 ENCOUNTER — Other Ambulatory Visit (HOSPITAL_BASED_OUTPATIENT_CLINIC_OR_DEPARTMENT_OTHER): Payer: Medicare Other | Admitting: Lab

## 2012-06-05 VITALS — BP 150/56 | HR 88 | Temp 97.3°F

## 2012-06-05 DIAGNOSIS — D469 Myelodysplastic syndrome, unspecified: Secondary | ICD-10-CM

## 2012-06-05 LAB — CBC WITH DIFFERENTIAL/PLATELET
Basophils Absolute: 0 10*3/uL (ref 0.0–0.1)
EOS%: 0.4 % (ref 0.0–7.0)
Eosinophils Absolute: 0 10*3/uL (ref 0.0–0.5)
HGB: 9.2 g/dL — ABNORMAL LOW (ref 13.0–17.1)
MCH: 30.1 pg (ref 27.2–33.4)
NEUT#: 1.4 10*3/uL — ABNORMAL LOW (ref 1.5–6.5)
RBC: 3.06 10*6/uL — ABNORMAL LOW (ref 4.20–5.82)
RDW: 19.2 % — ABNORMAL HIGH (ref 11.0–14.6)
lymph#: 1 10*3/uL (ref 0.9–3.3)
nRBC: 0 % (ref 0–0)

## 2012-06-05 MED ORDER — DARBEPOETIN ALFA-POLYSORBATE 300 MCG/0.6ML IJ SOLN
300.0000 ug | Freq: Once | INTRAMUSCULAR | Status: AC
  Administered 2012-06-05: 300 ug via SUBCUTANEOUS
  Filled 2012-06-05: qty 0.6

## 2012-06-05 NOTE — Patient Instructions (Addendum)
Darbepoetin Alfa injection What is this medicine? DARBEPOETIN ALFA (dar be POE e tin AL fa) helps your body make more red blood cells. It is used to treat anemia caused by chronic kidney failure and chemotherapy. This medicine may be used for other purposes; ask your health care provider or pharmacist if you have questions. What should I tell my health care provider before I take this medicine? They need to know if you have any of these conditions: -blood clotting disorders or history of blood clots -cancer patient not on chemotherapy -cystic fibrosis -heart disease, such as angina, heart failure, or a history of a heart attack -hemoglobin level of 12 g/dL or greater -high blood pressure -low levels of folate, iron, or vitamin B12 -seizures -an unusual or allergic reaction to darbepoetin, erythropoietin, albumin, hamster proteins, latex, other medicines, foods, dyes, or preservatives -pregnant or trying to get pregnant -breast-feeding How should I use this medicine? This medicine is for injection into a vein or under the skin. It is usually given by a health care professional in a hospital or clinic setting. If you get this medicine at home, you will be taught how to prepare and give this medicine. Do not shake the solution before you withdraw a dose. Use exactly as directed. Take your medicine at regular intervals. Do not take your medicine more often than directed. It is important that you put your used needles and syringes in a special sharps container. Do not put them in a trash can. If you do not have a sharps container, call your pharmacist or healthcare provider to get one. Talk to your pediatrician regarding the use of this medicine in children. While this medicine may be used in children as young as 1 year for selected conditions, precautions do apply. Overdosage: If you think you have taken too much of this medicine contact a poison control center or emergency room at once. NOTE:  This medicine is only for you. Do not share this medicine with others. What if I miss a dose? If you miss a dose, take it as soon as you can. If it is almost time for your next dose, take only that dose. Do not take double or extra doses. What may interact with this medicine? Do not take this medicine with any of the following medications: -epoetin alfa This list may not describe all possible interactions. Give your health care provider a list of all the medicines, herbs, non-prescription drugs, or dietary supplements you use. Also tell them if you smoke, drink alcohol, or use illegal drugs. Some items may interact with your medicine. What should I watch for while using this medicine? Visit your prescriber or health care professional for regular checks on your progress and for the needed blood tests and blood pressure measurements. It is especially important for the doctor to make sure your hemoglobin level is in the desired range, to limit the risk of potential side effects and to give you the best benefit. Keep all appointments for any recommended tests. Check your blood pressure as directed. Ask your doctor what your blood pressure should be and when you should contact him or her. As your body makes more red blood cells, you may need to take iron, folic acid, or vitamin B supplements. Ask your doctor or health care provider which products are right for you. If you have kidney disease continue dietary restrictions, even though this medication can make you feel better. Talk with your doctor or health care professional about the   foods you eat and the vitamins that you take. What side effects may I notice from receiving this medicine? Side effects that you should report to your doctor or health care professional as soon as possible: -allergic reactions like skin rash, itching or hives, swelling of the face, lips, or tongue -breathing problems -changes in vision -chest pain -confusion, trouble speaking  or understanding -feeling faint or lightheaded, falls -high blood pressure -muscle aches or pains -pain, swelling, warmth in the leg -rapid weight gain -severe headaches -sudden numbness or weakness of the face, arm or leg -trouble walking, dizziness, loss of balance or coordination -seizures (convulsions) -swelling of the ankles, feet, hands -unusually weak or tired Side effects that usually do not require medical attention (report to your doctor or health care professional if they continue or are bothersome): -diarrhea -fever, chills (flu-like symptoms) -headaches -nausea, vomiting -redness, stinging, or swelling at site where injected This list may not describe all possible side effects. Call your doctor for medical advice about side effects. You may report side effects to FDA at 1-800-FDA-1088. Where should I keep my medicine? Keep out of the reach of children. Store in a refrigerator between 2 and 8 degrees C (36 and 46 degrees F). Do not freeze. Do not shake. Throw away any unused portion if using a single-dose vial. Throw away any unused medicine after the expiration date. NOTE: This sheet is a summary. It may not cover all possible information. If you have questions about this medicine, talk to your doctor, pharmacist, or health care provider.  2013, [employer]. (02/20/2008 10:23:57 AM)  

## 2012-06-08 ENCOUNTER — Telehealth: Payer: Self-pay | Admitting: *Deleted

## 2012-06-08 NOTE — Telephone Encounter (Signed)
Spoke with patient.  Let him know blood counts are stable.  He returns on 06/26/12.   He appreciated the call.

## 2012-06-08 NOTE — Telephone Encounter (Signed)
Message copied by Orbie Hurst on Thu Jun 08, 2012  4:21 PM ------      Message from: [name]      Created: Tue Jun 06, 2012  8:15 AM       Call pt blood counts stable ------

## 2012-06-26 ENCOUNTER — Ambulatory Visit (HOSPITAL_BASED_OUTPATIENT_CLINIC_OR_DEPARTMENT_OTHER): Payer: Medicare Other

## 2012-06-26 ENCOUNTER — Other Ambulatory Visit (HOSPITAL_BASED_OUTPATIENT_CLINIC_OR_DEPARTMENT_OTHER): Payer: Medicare Other | Admitting: Lab

## 2012-06-26 VITALS — BP 107/44 | HR 74 | Temp 97.6°F

## 2012-06-26 DIAGNOSIS — D469 Myelodysplastic syndrome, unspecified: Secondary | ICD-10-CM

## 2012-06-26 LAB — CBC WITH DIFFERENTIAL/PLATELET
BASO%: 0.4 % (ref 0.0–2.0)
EOS%: 0.9 % (ref 0.0–7.0)
HGB: 8.8 g/dL — ABNORMAL LOW (ref 13.0–17.1)
MCH: 30.1 pg (ref 27.2–33.4)
MCHC: 31.3 g/dL — ABNORMAL LOW (ref 32.0–36.0)
RDW: 19.8 % — ABNORMAL HIGH (ref 11.0–14.6)
lymph#: 0.9 10*3/uL (ref 0.9–3.3)
nRBC: 0 % (ref 0–0)

## 2012-06-26 MED ORDER — DARBEPOETIN ALFA-POLYSORBATE 300 MCG/0.6ML IJ SOLN
300.0000 ug | Freq: Once | INTRAMUSCULAR | Status: AC
  Administered 2012-06-26: 300 ug via SUBCUTANEOUS
  Filled 2012-06-26: qty 0.6

## 2012-06-26 NOTE — Patient Instructions (Addendum)
Darbepoetin Alfa injection What is this medicine? DARBEPOETIN ALFA (dar be POE e tin AL fa) helps your body make more red blood cells. It is used to treat anemia caused by chronic kidney failure and chemotherapy. This medicine may be used for other purposes; ask your health care provider or pharmacist if you have questions. What should I tell my health care provider before I take this medicine? They need to know if you have any of these conditions: -blood clotting disorders or history of blood clots -cancer patient not on chemotherapy -cystic fibrosis -heart disease, such as angina, heart failure, or a history of a heart attack -hemoglobin level of 12 g/dL or greater -high blood pressure -low levels of folate, iron, or vitamin B12 -seizures -an unusual or allergic reaction to darbepoetin, erythropoietin, albumin, hamster proteins, latex, other medicines, foods, dyes, or preservatives -pregnant or trying to get pregnant -breast-feeding How should I use this medicine? This medicine is for injection into a vein or under the skin. It is usually given by a health care professional in a hospital or clinic setting. If you get this medicine at home, you will be taught how to prepare and give this medicine. Do not shake the solution before you withdraw a dose. Use exactly as directed. Take your medicine at regular intervals. Do not take your medicine more often than directed. It is important that you put your used needles and syringes in a special sharps container. Do not put them in a trash can. If you do not have a sharps container, call your pharmacist or healthcare provider to get one. Talk to your pediatrician regarding the use of this medicine in children. While this medicine may be used in children as young as 1 year for selected conditions, precautions do apply. Overdosage: If you think you have taken too much of this medicine contact a poison control center or emergency room at once. NOTE:  This medicine is only for you. Do not share this medicine with others. What if I miss a dose? If you miss a dose, take it as soon as you can. If it is almost time for your next dose, take only that dose. Do not take double or extra doses. What may interact with this medicine? Do not take this medicine with any of the following medications: -epoetin alfa This list may not describe all possible interactions. Give your health care provider a list of all the medicines, herbs, non-prescription drugs, or dietary supplements you use. Also tell them if you smoke, drink alcohol, or use illegal drugs. Some items may interact with your medicine. What should I watch for while using this medicine? Visit your prescriber or health care professional for regular checks on your progress and for the needed blood tests and blood pressure measurements. It is especially important for the doctor to make sure your hemoglobin level is in the desired range, to limit the risk of potential side effects and to give you the best benefit. Keep all appointments for any recommended tests. Check your blood pressure as directed. Ask your doctor what your blood pressure should be and when you should contact him or her. As your body makes more red blood cells, you may need to take iron, folic acid, or vitamin B supplements. Ask your doctor or health care provider which products are right for you. If you have kidney disease continue dietary restrictions, even though this medication can make you feel better. Talk with your doctor or health care professional about the   foods you eat and the vitamins that you take. What side effects may I notice from receiving this medicine? Side effects that you should report to your doctor or health care professional as soon as possible: -allergic reactions like skin rash, itching or hives, swelling of the face, lips, or tongue -breathing problems -changes in vision -chest pain -confusion, trouble speaking  or understanding -feeling faint or lightheaded, falls -high blood pressure -muscle aches or pains -pain, swelling, warmth in the leg -rapid weight gain -severe headaches -sudden numbness or weakness of the face, arm or leg -trouble walking, dizziness, loss of balance or coordination -seizures (convulsions) -swelling of the ankles, feet, hands -unusually weak or tired Side effects that usually do not require medical attention (report to your doctor or health care professional if they continue or are bothersome): -diarrhea -fever, chills (flu-like symptoms) -headaches -nausea, vomiting -redness, stinging, or swelling at site where injected This list may not describe all possible side effects. Call your doctor for medical advice about side effects. You may report side effects to FDA at 1-800-FDA-1088. Where should I keep my medicine? Keep out of the reach of children. Store in a refrigerator between 2 and 8 degrees C (36 and 46 degrees F). Do not freeze. Do not shake. Throw away any unused portion if using a single-dose vial. Throw away any unused medicine after the expiration date. NOTE: This sheet is a summary. It may not cover all possible information. If you have questions about this medicine, talk to your doctor, pharmacist, or health care provider.  2013, [employer]. (02/20/2008 10:23:57 AM)  

## 2012-06-29 ENCOUNTER — Telehealth: Payer: Self-pay | Admitting: *Deleted

## 2012-06-29 NOTE — Telephone Encounter (Signed)
Message copied by Sabino Snipes on Thu Jun 29, 2012 11:27 AM ------      Message from: [name]      Created: Mon Jun 26, 2012  3:23 PM       Call pts - counts close to his baseline; continue aranesp ------

## 2012-06-29 NOTE — Telephone Encounter (Signed)
Pt notified of lab results & need to cont aranesp per Dr Cyndie Chime.

## 2012-07-14 ENCOUNTER — Encounter: Payer: Self-pay | Admitting: Internal Medicine

## 2012-07-17 ENCOUNTER — Ambulatory Visit (HOSPITAL_BASED_OUTPATIENT_CLINIC_OR_DEPARTMENT_OTHER): Payer: Medicare Other

## 2012-07-17 ENCOUNTER — Other Ambulatory Visit (HOSPITAL_BASED_OUTPATIENT_CLINIC_OR_DEPARTMENT_OTHER): Payer: Medicare Other | Admitting: Lab

## 2012-07-17 VITALS — BP 121/54 | HR 85 | Temp 97.3°F

## 2012-07-17 DIAGNOSIS — D469 Myelodysplastic syndrome, unspecified: Secondary | ICD-10-CM

## 2012-07-17 LAB — CBC WITH DIFFERENTIAL/PLATELET
Eosinophils Absolute: 0 10*3/uL (ref 0.0–0.5)
MONO#: 0.1 10*3/uL (ref 0.1–0.9)
MONO%: 4.2 % (ref 0.0–14.0)
NEUT#: 1.7 10*3/uL (ref 1.5–6.5)
RBC: 3.22 10*6/uL — ABNORMAL LOW (ref 4.20–5.82)
RDW: 19.4 % — ABNORMAL HIGH (ref 11.0–14.6)
WBC: 2.9 10*3/uL — ABNORMAL LOW (ref 4.0–10.3)
nRBC: 0 % (ref 0–0)

## 2012-07-17 MED ORDER — DARBEPOETIN ALFA-POLYSORBATE 300 MCG/0.6ML IJ SOLN
300.0000 ug | Freq: Once | INTRAMUSCULAR | Status: AC
  Administered 2012-07-17: 300 ug via SUBCUTANEOUS
  Filled 2012-07-17: qty 0.6

## 2012-07-19 ENCOUNTER — Telehealth: Payer: Self-pay | Admitting: *Deleted

## 2012-07-19 NOTE — Telephone Encounter (Signed)
Pt notified of stable lab results per Dr Patsy Lager request.

## 2012-07-19 NOTE — Telephone Encounter (Signed)
Message copied by Sabino Snipes on Wed Jul 19, 2012 12:47 PM ------      Message from: [name]      Created: Mon Jul 17, 2012  8:26 PM       Call pt lab stable at his baseline ------

## 2012-08-07 ENCOUNTER — Ambulatory Visit (HOSPITAL_BASED_OUTPATIENT_CLINIC_OR_DEPARTMENT_OTHER): Payer: Medicare Other

## 2012-08-07 ENCOUNTER — Other Ambulatory Visit (HOSPITAL_BASED_OUTPATIENT_CLINIC_OR_DEPARTMENT_OTHER): Payer: Medicare Other | Admitting: Lab

## 2012-08-07 VITALS — BP 108/50 | HR 87 | Temp 97.2°F

## 2012-08-07 DIAGNOSIS — D469 Myelodysplastic syndrome, unspecified: Secondary | ICD-10-CM

## 2012-08-07 DIAGNOSIS — D4620 Refractory anemia with excess of blasts, unspecified: Secondary | ICD-10-CM

## 2012-08-07 LAB — CBC WITH DIFFERENTIAL/PLATELET
BASO%: 0 % (ref 0.0–2.0)
HCT: 30.5 % — ABNORMAL LOW (ref 38.4–49.9)
HGB: 9.7 g/dL — ABNORMAL LOW (ref 13.0–17.1)
MCHC: 31.8 g/dL — ABNORMAL LOW (ref 32.0–36.0)
MONO#: 0.1 10*3/uL (ref 0.1–0.9)
NEUT%: 55.3 % (ref 39.0–75.0)
RDW: 19.1 % — ABNORMAL HIGH (ref 11.0–14.6)
WBC: 2.6 10*3/uL — ABNORMAL LOW (ref 4.0–10.3)
lymph#: 1.1 10*3/uL (ref 0.9–3.3)

## 2012-08-07 MED ORDER — DARBEPOETIN ALFA-POLYSORBATE 300 MCG/0.6ML IJ SOLN
300.0000 ug | Freq: Once | INTRAMUSCULAR | Status: AC
  Administered 2012-08-07: 300 ug via SUBCUTANEOUS
  Filled 2012-08-07: qty 0.6

## 2012-08-09 ENCOUNTER — Telehealth: Payer: Self-pay | Admitting: *Deleted

## 2012-08-09 NOTE — Telephone Encounter (Signed)
Message copied by Gala Romney on Wed Aug 09, 2012  3:47 PM ------      Message from: [name]      Created: Mon Aug 07, 2012  3:52 PM       Call pt counts stable ------

## 2012-08-09 NOTE — Telephone Encounter (Signed)
Notified pt counts are stable.  Pt verbalized understanding and confirmed appt for 08/28/12.

## 2012-08-21 ENCOUNTER — Other Ambulatory Visit: Payer: Self-pay | Admitting: Internal Medicine

## 2012-08-28 ENCOUNTER — Other Ambulatory Visit (HOSPITAL_BASED_OUTPATIENT_CLINIC_OR_DEPARTMENT_OTHER): Payer: Medicare Other | Admitting: Lab

## 2012-08-28 ENCOUNTER — Ambulatory Visit (HOSPITAL_BASED_OUTPATIENT_CLINIC_OR_DEPARTMENT_OTHER): Payer: Medicare Other

## 2012-08-28 VITALS — BP 101/46 | HR 89 | Temp 97.9°F

## 2012-08-28 DIAGNOSIS — D469 Myelodysplastic syndrome, unspecified: Secondary | ICD-10-CM

## 2012-08-28 LAB — CBC WITH DIFFERENTIAL/PLATELET
Basophils Absolute: 0 10*3/uL (ref 0.0–0.1)
Eosinophils Absolute: 0 10*3/uL (ref 0.0–0.5)
HGB: 10.1 g/dL — ABNORMAL LOW (ref 13.0–17.1)
MCV: 96 fL (ref 79.3–98.0)
MONO#: 0.1 10*3/uL (ref 0.1–0.9)
MONO%: 3 % (ref 0.0–14.0)
NEUT#: 2.9 10*3/uL (ref 1.5–6.5)
RDW: 20.5 % — ABNORMAL HIGH (ref 11.0–14.6)
WBC: 4 10*3/uL (ref 4.0–10.3)
lymph#: 0.9 10*3/uL (ref 0.9–3.3)

## 2012-08-28 MED ORDER — DARBEPOETIN ALFA-POLYSORBATE 300 MCG/0.6ML IJ SOLN
300.0000 ug | Freq: Once | INTRAMUSCULAR | Status: AC
  Administered 2012-08-28: 300 ug via SUBCUTANEOUS
  Filled 2012-08-28: qty 0.6

## 2012-08-30 ENCOUNTER — Telehealth: Payer: Self-pay | Admitting: *Deleted

## 2012-08-30 NOTE — Telephone Encounter (Signed)
Message copied by Sabino Snipes on Wed Aug 30, 2012  3:46 PM ------      Message from: [name]      Created: Mon Aug 28, 2012  6:55 PM       Call pt counts stable ------

## 2012-08-30 NOTE — Telephone Encounter (Signed)
Pt notified that counts are stable.  He was pleased with his Hgb results stating that it hadn't been this high in a long time.

## 2012-09-04 ENCOUNTER — Inpatient Hospital Stay (HOSPITAL_COMMUNITY)
Admission: EM | Admit: 2012-09-04 | Discharge: 2012-09-06 | DRG: 189 | Disposition: A | Discharge status: Home Health | Payer: Medicare Other | Attending: Internal Medicine | Admitting: Internal Medicine

## 2012-09-04 ENCOUNTER — Encounter (HOSPITAL_COMMUNITY): Payer: Self-pay | Admitting: Emergency Medicine

## 2012-09-04 ENCOUNTER — Emergency Department (HOSPITAL_COMMUNITY): Payer: Medicare Other

## 2012-09-04 DIAGNOSIS — I509 Heart failure, unspecified: Secondary | ICD-10-CM

## 2012-09-04 DIAGNOSIS — I272 Pulmonary hypertension, unspecified: Secondary | ICD-10-CM | POA: Diagnosis present

## 2012-09-04 DIAGNOSIS — R0902 Hypoxemia: Secondary | ICD-10-CM

## 2012-09-04 DIAGNOSIS — I5042 Chronic combined systolic (congestive) and diastolic (congestive) heart failure: Secondary | ICD-10-CM | POA: Diagnosis present

## 2012-09-04 DIAGNOSIS — D510 Vitamin B12 deficiency anemia due to intrinsic factor deficiency: Secondary | ICD-10-CM | POA: Diagnosis present

## 2012-09-04 DIAGNOSIS — R5383 Other fatigue: Secondary | ICD-10-CM | POA: Diagnosis present

## 2012-09-04 DIAGNOSIS — E039 Hypothyroidism, unspecified: Secondary | ICD-10-CM | POA: Diagnosis present

## 2012-09-04 DIAGNOSIS — I428 Other cardiomyopathies: Secondary | ICD-10-CM | POA: Diagnosis present

## 2012-09-04 DIAGNOSIS — E875 Hyperkalemia: Secondary | ICD-10-CM | POA: Diagnosis not present

## 2012-09-04 DIAGNOSIS — J8410 Pulmonary fibrosis, unspecified: Secondary | ICD-10-CM

## 2012-09-04 DIAGNOSIS — E43 Unspecified severe protein-calorie malnutrition: Secondary | ICD-10-CM | POA: Diagnosis present

## 2012-09-04 DIAGNOSIS — I5032 Chronic diastolic (congestive) heart failure: Secondary | ICD-10-CM

## 2012-09-04 DIAGNOSIS — D469 Myelodysplastic syndrome, unspecified: Secondary | ICD-10-CM | POA: Diagnosis present

## 2012-09-04 DIAGNOSIS — R5381 Other malaise: Secondary | ICD-10-CM | POA: Diagnosis present

## 2012-09-04 DIAGNOSIS — I5040 Unspecified combined systolic (congestive) and diastolic (congestive) heart failure: Secondary | ICD-10-CM | POA: Diagnosis present

## 2012-09-04 DIAGNOSIS — J9600 Acute respiratory failure, unspecified whether with hypoxia or hypercapnia: Principal | ICD-10-CM | POA: Diagnosis present

## 2012-09-04 DIAGNOSIS — I2789 Other specified pulmonary heart diseases: Secondary | ICD-10-CM | POA: Diagnosis present

## 2012-09-04 DIAGNOSIS — I420 Dilated cardiomyopathy: Secondary | ICD-10-CM | POA: Diagnosis present

## 2012-09-04 DIAGNOSIS — J849 Interstitial pulmonary disease, unspecified: Secondary | ICD-10-CM

## 2012-09-04 DIAGNOSIS — R0602 Shortness of breath: Secondary | ICD-10-CM

## 2012-09-04 LAB — BASIC METABOLIC PANEL
Calcium: 9 mg/dL (ref 8.4–10.5)
GFR calc Af Amer: 54 mL/min — ABNORMAL LOW (ref >90)
GFR calc non Af Amer: 47 mL/min — ABNORMAL LOW (ref >90)
Potassium: 4.9 mEq/L (ref 3.5–5.1)
Sodium: 138 mEq/L (ref 135–145)

## 2012-09-04 LAB — URINALYSIS, ROUTINE W REFLEX MICROSCOPIC
Bilirubin Urine: NEGATIVE
Ketones, ur: NEGATIVE mg/dL
Nitrite: NEGATIVE
Urobilinogen, UA: 0.2 mg/dL (ref 0.0–1.0)

## 2012-09-04 LAB — CBC
MCHC: 31.2 g/dL (ref 30.0–36.0)
Platelets: DECREASED 10*3/uL (ref 150–400)
RDW: 19.1 % — ABNORMAL HIGH (ref 11.5–15.5)

## 2012-09-04 LAB — PRO B NATRIURETIC PEPTIDE: Pro B Natriuretic peptide (BNP): 1576 pg/mL — ABNORMAL HIGH (ref 0–450)

## 2012-09-04 LAB — D-DIMER, QUANTITATIVE: D-Dimer, Quant: 0.41 ug/mL-FEU (ref 0.00–0.48)

## 2012-09-04 MED ORDER — ALBUTEROL SULFATE (5 MG/ML) 0.5% IN NEBU
5.0000 mg | INHALATION_SOLUTION | Freq: Once | RESPIRATORY_TRACT | Status: AC
  Administered 2012-09-04: 5 mg via RESPIRATORY_TRACT
  Filled 2012-09-04: qty 1

## 2012-09-04 MED ORDER — LEVOFLOXACIN IN D5W 750 MG/150ML IV SOLN
750.0000 mg | INTRAVENOUS | Status: DC
  Administered 2012-09-04: 750 mg via INTRAVENOUS
  Filled 2012-09-04: qty 150

## 2012-09-04 MED ORDER — IPRATROPIUM BROMIDE 0.02 % IN SOLN
0.5000 mg | Freq: Once | RESPIRATORY_TRACT | Status: AC
  Administered 2012-09-04: 0.5 mg via RESPIRATORY_TRACT
  Filled 2012-09-04: qty 2.5

## 2012-09-04 MED ORDER — METHYLPREDNISOLONE SODIUM SUCC 125 MG IJ SOLR
125.0000 mg | Freq: Once | INTRAMUSCULAR | Status: AC
  Administered 2012-09-04: 125 mg via INTRAVENOUS
  Filled 2012-09-04: qty 2

## 2012-09-04 NOTE — H&P (Signed)
[facility] History and Physical  Dwayne Stuart ZOX:[id number] DOB: 1914-05-30 DOA: 09/04/2012  Referring physician: Dr. Karma Ganja PCP: Kimber Relic, MD  Specialists: none  Chief Complaint: weakness and dypnea  HPI: Dwayne Stuart is a 79 y.o. male  Past medical history of chronic diastolic heart failure, myelodysplastic syndrome who lives in independent living facility that comes in for lower extremity weakness and shortness of breath. The patient is a poor historian as when you ask him specific questions he gives you a vague answer. He was here in the ED and while sitting down his saturations were running below 88%. He relates that at home he rarely ambulates as he uses a scooter to get around. But he has gotten progressive to be weak in his lower extremities.  He denies any fever no change in his cough, nausea vomiting or diarrhea. No sick contacts.  In the ED: He was ambulated and his saturations drop below 88%. Chest x-ray was done that showed chronic interstitial lung disease. A BMP was done showed 1576, cardiac enzymes were negative x1, CBC showed a white count of 2.3 hemoglobin of 8.8 and platelets were clumped. D-dimer was done that showed 0.41, and UA was clean so we were asked to admit and further evaluate.  Review of Systems: The patient denies anorexia, fever, weight loss,, vision loss, decreased hearing, hoarseness, chest pain, syncope, dyspnea on exertion, peripheral edema, balance deficits, hemoptysis, abdominal pain, melena, hematochezia, severe indigestion/heartburn, hematuria, incontinence, genital sores, muscle weakness, suspicious skin lesions, transient blindness, difficulty walking, depression, unusual weight change, abnormal bleeding, enlarged lymph nodes, angioedema, and breast masses.    Past Medical History  Diagnosis Date  . Anemia, unspecified   . 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 Hypertrophy of prostate without  urinary obstruction and other lower urinary tract symptoms (LUTS)   . Osteoarthrosis, unspecified whether generalized or localized, unspecified site   . Abnormality of gait   . Dysphagia, unspecified(787.20)   . Stiffness of joints, not elsewhere classified, multiple sites   . Unspecified hypothyroidism   . Hemorrhoids   . Pain in joint, site unspecified   . Other malaise and fatigue   . Slowing of urinary stream   . Pernicious anemia 03/08/2011  . Hypothyroidism 03/08/2011  . BPH (benign prostatic hypertrophy) 03/08/2011  . History of Zenker's diverticulum removal 03/08/2011  . Degenerative arthritis of lumbar spine 03/08/2011   Past Surgical History  Procedure Laterality Date  . Tonsillectomy    . Cholecystectomy     Social History:  reports that he has never smoked. He does not have any smokeless tobacco history on file. He reports that he does not drink alcohol or use illicit drugs.   No Known Allergies  Family History  Problem Relation Age of Onset  . Heart disease Father     Prior to Admission medications   Medication Sig Start Date End Date Taking? Authorizing Provider  Cholecalciferol (VITAMIN D) 1000 UNITS capsule Take 1,000 Units by mouth daily.     Yes Historical Provider, MD  doxazosin (CARDURA) 2 MG tablet Take 2 mg by mouth daily.    Yes Historical Provider, MD  finasteride (PROSCAR) 5 MG tablet Take 5 mg by mouth daily.   Yes Historical Provider, MD  folic acid (FOLVITE) 1 MG tablet Take 1 mg by mouth daily.   Yes Historical Provider, MD  levothyroxine (SYNTHROID, LEVOTHROID) 100 MCG tablet Take 100 mcg by mouth daily before breakfast.  Yes Historical Provider, MD  naproxen sodium (ANAPROX) 220 MG tablet Take 440 mg by mouth 2 (two) times daily as needed (for pain).    Yes Historical Provider, MD  PROAIR HFA 108 (90 BASE) MCG/ACT inhaler Inhale 2 puffs into the lungs every 4 (four) hours as needed for wheezing or shortness of breath.  08/05/11  Yes Historical  Provider, MD   Physical Exam: Filed Vitals:   09/04/12 1657 09/04/12 1908 09/04/12 2055 09/04/12 2152  BP: 119/51 127/60 117/59   Pulse: 78 77 70   Temp: 97.6 F (36.4 C) 98 F (36.7 C)    TempSrc: Oral Oral    Resp: 18 24 18    SpO2: 97% 99% 98% 95%    BP 117/59  Pulse 70  Temp(Src) 98 F (36.7 C) (Oral)  Resp 18  SpO2 95%  General Appearance:    Alert, cooperative, no distress, appears stated age cachectic appearing   Head:    Normocephalic, without obvious abnormality, atraumatic           Throat:   Lips, mucosa, and tongue normal; teeth and gums normal  Neck:   Supple, symmetrical, trachea midline, no adenopathy;       thyroid:  No enlargement/tenderness/nodules; no carotid   bruit or JVD  Back:     Symmetric, no curvature, ROM normal, no CVA tenderness  Lungs:     moderate air movement, with crackles and some very mild wheezing   Chest wall:    No tenderness or deformity  Heart:    Regular rate and rhythm, S1 and S2 normal, no murmur, rub   or gallop  Abdomen:     Soft, non-tender, bowel sounds active all four quadrants,    no masses, no organomegaly        Extremities:   Extremities normal, atraumatic, no cyanosis or edema  Pulses:   2+ and symmetric all extremities  Skin:   Skin color, texture, turgor normal, no rashes or lesions  Lymph nodes:   Cervical, supraclavicular, and axillary nodes normal  Neurologic:   CNII-XII intact. Normal strength, except for his lower extremities which are 4-5 bilaterally and symmetrical sensation and reflexes are intact     throughout     Labs on Admission:  Basic Metabolic Panel:  Recent Labs Lab 09/04/12 1804  NA 138  K 4.9  CL 105  CO2 27  GLUCOSE 101*  BUN 32*  CREATININE 1.26  CALCIUM 9.0   Liver Function Tests: No results found for this basename: AST, ALT, ALKPHOS, BILITOT, PROT, ALBUMIN,  in the last 168 hours No results found for this basename: LIPASE, AMYLASE,  in the last 168 hours No results found for  this basename: AMMONIA,  in the last 168 hours CBC:  Recent Labs Lab 09/04/12 1804  WBC 2.3*  HGB 8.8*  HCT 28.2*  MCV 96.2  PLT PLATELET CLUMPS NOTED ON SMEAR, COUNT APPEARS DECREASED   Cardiac Enzymes:  Recent Labs Lab 09/04/12 1804  TROPONINI <0.30    BNP (last 3 results)  Recent Labs  09/04/12 1804  PROBNP 1576.0*   CBG: No results found for this basename: GLUCAP,  in the last 168 hours  Radiological Exams on Admission: Dg Chest 2 View  09/04/2012   *RADIOLOGY REPORT*  Clinical Data: Shortness of breath, weakness.  CHEST - 2 VIEW  Comparison: 06/02/2010  Findings: Cardiomegaly.  Diffuse chronic changes throughout the lungs compatible chronic interstitial lung disease.  No change since prior study.  No definite  acute process.  No visible effusions or acute bony abnormality.  Degenerative changes in the shoulders.  IMPRESSION: Stable severe chronic interstitial lung disease.  Mild cardiomegaly.  No definite acute process.   Original Report Authenticated By: Charlett Nose, M.D.    EKG: Independently reviewed. Pending  Assessment/Plan  Acute respiratory failure - Unclear etiology, he does have chronic diastolic heart failure, he has no JVD on physical exam does have crackles on lung exam which could be 2 to his chronic lung disease. He also has some minimal wheezing. Which can be contributing to his respiratory failure.  - Oh go ahead and start him on steroids her verbal score even up. We'll: Apparently with Levaquin. And will ambulate in the morning to see the desats. - He doesn't have any appreciative infiltrates on chest x-ray. We'll get a CT scan without contrast to further evaluate his lung status. If he does not improve then it would be worth getting a pulmonary consult for further evaluation.   Myelodysplastic syndrome - Stable continue to monitor.   Chronic diastolic CHF (congestive heart failure) - Compensated no change.   Severe protein-calorie  malnutrition - Ensure 3 times a day    Code Status: DNR Family Communication: none Disposition Plan: inpatient 2-3 days  Time spent: 90 minutes Marinda Elk [facility] Pager [phone] If 7PM-7AM, please contact night-coverage [email or web address] Password [facility] 09/04/2012, 11:23 PM

## 2012-09-04 NOTE — ED Notes (Signed)
ZOX:[id number]<AV> Expected date:<BR> Expected time:<BR> Means of arrival:<BR> Comments:<BR> 79yo-Weakness

## 2012-09-04 NOTE — ED Notes (Signed)
[facility] presents with 79 yo male from [facility] with weakness.  Pt stated that he was going to shave and felt weaker than normal.  Denies pain, SOB or diaphoresis.  Denies feeling of fever; however, diminished breath sound with rales bilaterally in lower lobes.  Stroke screen negative. A&Ox4.  Staff reported SATs in lower 80s; [facility] arrived, pt at  93% on 2L/ raised O2 level to 4 liters/99%.  Denies cardiac history.

## 2012-09-04 NOTE — ED Provider Notes (Signed)
History     CSN: [id number]  Arrival date & time 09/04/12  1635   First MD Initiated Contact with Patient 09/04/12 1703      Chief Complaint  Patient presents with  . Weakness    (Consider location/radiation/quality/duration/timing/severity/associated sxs/prior treatment) HPI Pt presenting with c/o generalized weakness. He states that when he was starting to shave he was feeling weaker than his usual. He denies shortness of breath- although he appears sob on evaluation.  He denies chest pain, no leg swelling.  No recent cough or fevers.  No syncope or palpitations.  He states he has a hx of anemia, and has this checked every 3 weeks and receives injections- he states on most recent check his hemoglobin was good.  Upon arrival he was placed on O2 due to o2 sats in 80s.  There are no other associated systemic symptoms, there are no other alleviating or modifying factors.   Past Medical History  Diagnosis Date  . Anemia, unspecified   . 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 Hypertrophy of prostate without urinary obstruction and other lower urinary tract symptoms (LUTS)   . Osteoarthrosis, unspecified whether generalized or localized, unspecified site   . Abnormality of gait   . Dysphagia, unspecified(787.20)   . Stiffness of joints, not elsewhere classified, multiple sites   . Unspecified hypothyroidism   . Hemorrhoids   . Pain in joint, site unspecified   . Other malaise and fatigue   . Slowing of urinary stream   . Pernicious anemia 03/08/2011  . Hypothyroidism 03/08/2011  . BPH (benign prostatic hypertrophy) 03/08/2011  . History of Zenker's diverticulum removal 03/08/2011  . Degenerative arthritis of lumbar spine 03/08/2011    Past Surgical History  Procedure Laterality Date  . Tonsillectomy    . Cholecystectomy      Family History  Problem Relation Age of Onset  . Heart disease Father     History  Substance Use Topics  .  Smoking status: Never Smoker   . Smokeless tobacco: Not on file  . Alcohol Use: No      Review of Systems ROS reviewed and all otherwise negative except for mentioned in HPI  Allergies  Review of patient's allergies indicates no known allergies.  Home Medications   Current Outpatient Rx  Name  Route  Sig  Dispense  Refill  . Cholecalciferol (VITAMIN D) 1000 UNITS capsule   Oral   Take 1,000 Units by mouth daily.           Marland Kitchen doxazosin (CARDURA) 2 MG tablet   Oral   Take 2 mg by mouth daily.          . finasteride (PROSCAR) 5 MG tablet   Oral   Take 5 mg by mouth daily.         . folic acid (FOLVITE) 1 MG tablet   Oral   Take 1 mg by mouth daily.         Marland Kitchen levothyroxine (SYNTHROID, LEVOTHROID) 100 MCG tablet   Oral   Take 100 mcg by mouth daily before breakfast.         . naproxen sodium (ANAPROX) 220 MG tablet   Oral   Take 440 mg by mouth 2 (two) times daily as needed (for pain).          Marland Kitchen PROAIR HFA 108 (90 BASE) MCG/ACT inhaler   Inhalation   Inhale 2 puffs into the lungs every 4 (four)  hours as needed for wheezing or shortness of breath.            BP 117/59  Pulse 70  Temp(Src) 98 F (36.7 C) (Oral)  Resp 18  SpO2 95% Vitals reviewed Physical Exam Physical Examination: General appearance - alert, well appearing, and in no distress Mental status - alert, oriented to person, place, and time Eyes - no conjunctival injection, no scleral icterus Mouth - mucous membranes moist, pharynx normal without lesions Neck - supple, no significant adenopathy Chest - faint rhonchi bilaterally, mildly tachpneic, speaking in 2-3 word sentences, breath sounds symmetric, no wheezes, rales or rhonchi, symmetric air entry Heart - normal rate, regular rhythm, normal S1, S2, no murmurs, rubs, clicks or gallops Abdomen - soft, nontender, nondistended, no masses or organomegaly Extremities - peripheral pulses normal, no pedal edema, no clubbing or cyanosis Skin  - normal coloration and turgor, no rashes  ED Course  Procedures (including critical care time)   Date: 09/04/2012  Rate: 77  Rhythm: normal sinus rhythm  QRS Axis: normal  Intervals: indeterminate  ST/T Wave abnormalities: nonspecific ST/T changes  Conduction Disutrbances:left bundle branch block  Narrative Interpretation:   Old EKG Reviewed: QRS widened, new LBBB compared to prior ekg of 06/01/10  10:52 PM d/w Dr. David Stall, [facility], pt to be admitted to [facility], team 8, telemetry.    Labs Reviewed  CBC - Abnormal; Notable for the following:    WBC 2.3 (*)    RBC 2.93 (*)    Hemoglobin 8.8 (*)    HCT 28.2 (*)    RDW 19.1 (*)    All other components within normal limits  BASIC METABOLIC PANEL - Abnormal; Notable for the following:    Glucose, Bld 101 (*)    BUN 32 (*)    GFR calc non Af Amer 47 (*)    GFR calc Af Amer 54 (*)    All other components within normal limits  PRO B NATRIURETIC PEPTIDE - Abnormal; Notable for the following:    Pro B Natriuretic peptide (BNP) 1576.0 (*)    All other components within normal limits  URINALYSIS, ROUTINE W REFLEX MICROSCOPIC - Abnormal; Notable for the following:    Hgb urine dipstick SMALL (*)    All other components within normal limits  TROPONIN I  D-DIMER, QUANTITATIVE  URINE MICROSCOPIC-ADD ON   Dg Chest 2 View  09/04/2012   *RADIOLOGY REPORT*  Clinical Data: Shortness of breath, weakness.  CHEST - 2 VIEW  Comparison: 06/02/2010  Findings: Cardiomegaly.  Diffuse chronic changes throughout the lungs compatible chronic interstitial lung disease.  No change since prior study.  No definite acute process.  No visible effusions or acute bony abnormality.  Degenerative changes in the shoulders.  IMPRESSION: Stable severe chronic interstitial lung disease.  Mild cardiomegaly.  No definite acute process.   Original Report Authenticated By: Charlett Nose, M.D.     1. Shortness of breath   2. Hypoxia   3. Acute respiratory failure   4.  Chronic diastolic CHF (congestive heart failure)   5. Chronic interstitial lung disease   6. Myelodysplastic syndrome       MDM  Pt presenting with c/o weakness, he appears short of breath and is hypoxic on RA.  Workup reveals cxr with chronic interstitial lung disease appearance- pt denies any history of this, but on chart review has had prior similar appearance of CXR.  EKG and troponin and d-dimer reassuring. Mild elevation in BNP, but no fluid on CXR  and no peripheral edema.  Anemia of 8.8 which is not significantly decreased from his baseline of 9-10.  Pt with continued O2 requirement on recheck- o2 sats in high 80s on RA with some tachypnea.  Pt treated with albuterol/atrovent, levaquin and steroids.  D/w [facility] for admission to telemetry bed.          Ethelda Chick, MD 09/05/12 [phone]

## 2012-09-05 ENCOUNTER — Inpatient Hospital Stay (HOSPITAL_COMMUNITY): Payer: Medicare Other

## 2012-09-05 DIAGNOSIS — I059 Rheumatic mitral valve disease, unspecified: Secondary | ICD-10-CM

## 2012-09-05 LAB — TROPONIN I
Troponin I: <0.3 ng/mL (ref <0.30)
Troponin I: <0.3 ng/mL (ref <0.30)
Troponin I: <0.3 ng/mL (ref <0.30)

## 2012-09-05 LAB — CBC
MCHC: 31.1 g/dL (ref 30.0–36.0)
Platelets: 54 10*3/uL — ABNORMAL LOW (ref 150–400)
RDW: 19.2 % — ABNORMAL HIGH (ref 11.5–15.5)
WBC: 1.7 10*3/uL — ABNORMAL LOW (ref 4.0–10.5)

## 2012-09-05 LAB — COMPREHENSIVE METABOLIC PANEL
AST: 17 U/L (ref 0–37)
Albumin: 3 g/dL — ABNORMAL LOW (ref 3.5–5.2)
Alkaline Phosphatase: 72 U/L (ref 39–117)
BUN: 33 mg/dL — ABNORMAL HIGH (ref 6–23)
Chloride: 103 mEq/L (ref 96–112)
Potassium: 4.7 mEq/L (ref 3.5–5.1)
Total Bilirubin: 0.6 mg/dL (ref 0.3–1.2)
Total Protein: 6.9 g/dL (ref 6.0–8.3)

## 2012-09-05 LAB — TSH: TSH: 0.501 u[IU]/mL (ref 0.350–4.500)

## 2012-09-05 LAB — PROTIME-INR
INR: 1.07 (ref 0.00–1.49)
Prothrombin Time: 13.8 seconds (ref 11.6–15.2)

## 2012-09-05 LAB — MRSA PCR SCREENING: MRSA by PCR: NEGATIVE

## 2012-09-05 MED ORDER — ALBUTEROL SULFATE (5 MG/ML) 0.5% IN NEBU: 2.5000 mg | INHALATION_SOLUTION | RESPIRATORY_TRACT | Status: DC | PRN

## 2012-09-05 MED ORDER — DOXAZOSIN MESYLATE 2 MG PO TABS
2.0000 mg | ORAL_TABLET | Freq: Every day | ORAL | Status: DC
  Administered 2012-09-05 – 2012-09-06 (×2): 2 mg via ORAL
  Filled 2012-09-05 (×2): qty 1

## 2012-09-05 MED ORDER — FOLIC ACID 1 MG PO TABS
1.0000 mg | ORAL_TABLET | Freq: Every day | ORAL | Status: DC
  Administered 2012-09-05 – 2012-09-06 (×2): 1 mg via ORAL
  Filled 2012-09-05 (×2): qty 1

## 2012-09-05 MED ORDER — SODIUM CHLORIDE 0.9 % IJ SOLN: 3.0000 mL | INTRAMUSCULAR | Status: DC | PRN

## 2012-09-05 MED ORDER — METHYLPREDNISOLONE SODIUM SUCC 40 MG IJ SOLR
40.0000 mg | Freq: Two times a day (BID) | INTRAMUSCULAR | Status: AC
  Administered 2012-09-05 (×2): 40 mg via INTRAVENOUS
  Filled 2012-09-05 (×2): qty 1

## 2012-09-05 MED ORDER — LEVOFLOXACIN IN D5W 500 MG/100ML IV SOLN
500.0000 mg | INTRAVENOUS | Status: DC
  Filled 2012-09-05: qty 100

## 2012-09-05 MED ORDER — HEPARIN SODIUM (PORCINE) 5000 UNIT/ML IJ SOLN
5000.0000 [IU] | Freq: Three times a day (TID) | INTRAMUSCULAR | Status: DC
  Administered 2012-09-05 – 2012-09-06 (×4): 5000 [IU] via SUBCUTANEOUS
  Filled 2012-09-05 (×7): qty 1

## 2012-09-05 MED ORDER — SODIUM CHLORIDE 0.9 % IJ SOLN: 3.0000 mL | Freq: Two times a day (BID) | INTRAMUSCULAR | Status: DC

## 2012-09-05 MED ORDER — SODIUM CHLORIDE 0.9 % IJ SOLN
3.0000 mL | Freq: Two times a day (BID) | INTRAMUSCULAR | Status: DC
  Administered 2012-09-05: 3 mL via INTRAVENOUS

## 2012-09-05 MED ORDER — ALBUTEROL SULFATE (5 MG/ML) 0.5% IN NEBU
2.5000 mg | INHALATION_SOLUTION | Freq: Four times a day (QID) | RESPIRATORY_TRACT | Status: DC
  Filled 2012-09-05: qty 0.5

## 2012-09-05 MED ORDER — NAPROXEN 250 MG PO TABS
250.0000 mg | ORAL_TABLET | Freq: Two times a day (BID) | ORAL | Status: DC | PRN
  Filled 2012-09-05: qty 1

## 2012-09-05 MED ORDER — ALBUTEROL SULFATE HFA 108 (90 BASE) MCG/ACT IN AERS
2.0000 | INHALATION_SPRAY | RESPIRATORY_TRACT | Status: DC | PRN
  Filled 2012-09-05: qty 6.7

## 2012-09-05 MED ORDER — ALUM & MAG HYDROXIDE-SIMETH 200-200-20 MG/5ML PO SUSP: 30.0000 mL | Freq: Four times a day (QID) | ORAL | Status: DC | PRN

## 2012-09-05 MED ORDER — FINASTERIDE 5 MG PO TABS
5.0000 mg | ORAL_TABLET | Freq: Every day | ORAL | Status: DC
  Administered 2012-09-05 – 2012-09-06 (×2): 5 mg via ORAL
  Filled 2012-09-05 (×2): qty 1

## 2012-09-05 MED ORDER — SODIUM CHLORIDE 0.9 % IV SOLN: 250.0000 mL | INTRAVENOUS | Status: DC | PRN

## 2012-09-05 MED ORDER — IPRATROPIUM BROMIDE 0.02 % IN SOLN
0.5000 mg | Freq: Four times a day (QID) | RESPIRATORY_TRACT | Status: DC
  Administered 2012-09-05: 0.5 mg via RESPIRATORY_TRACT
  Filled 2012-09-05 (×2): qty 2.5

## 2012-09-05 MED ORDER — LEVOFLOXACIN 500 MG PO TABS
500.0000 mg | ORAL_TABLET | ORAL | Status: DC
  Administered 2012-09-06: 500 mg via ORAL
  Filled 2012-09-05: qty 1

## 2012-09-05 MED ORDER — POLYETHYLENE GLYCOL 3350 17 G PO PACK
17.0000 g | PACK | Freq: Every day | ORAL | Status: DC | PRN
  Filled 2012-09-05: qty 1

## 2012-09-05 MED ORDER — ACETAMINOPHEN 325 MG PO TABS: 650.0000 mg | ORAL_TABLET | Freq: Four times a day (QID) | ORAL | Status: DC | PRN

## 2012-09-05 MED ORDER — LEVOTHYROXINE SODIUM 100 MCG PO TABS
100.0000 ug | ORAL_TABLET | Freq: Every day | ORAL | Status: DC
  Administered 2012-09-05 – 2012-09-06 (×2): 100 ug via ORAL
  Filled 2012-09-05 (×3): qty 1

## 2012-09-05 MED ORDER — ACETAMINOPHEN 650 MG RE SUPP: 650.0000 mg | Freq: Four times a day (QID) | RECTAL | Status: DC | PRN

## 2012-09-05 MED ORDER — PREDNISONE 20 MG PO TABS
40.0000 mg | ORAL_TABLET | Freq: Every day | ORAL | Status: DC
  Administered 2012-09-06: 40 mg via ORAL
  Filled 2012-09-05 (×2): qty 2

## 2012-09-05 MED ORDER — ONDANSETRON HCL 4 MG/2ML IJ SOLN: 4.0000 mg | Freq: Four times a day (QID) | INTRAMUSCULAR | Status: DC | PRN

## 2012-09-05 MED ORDER — IPRATROPIUM BROMIDE 0.02 % IN SOLN: 0.5000 mg | RESPIRATORY_TRACT | Status: DC | PRN

## 2012-09-05 MED ORDER — ONDANSETRON HCL 4 MG PO TABS: 4.0000 mg | ORAL_TABLET | Freq: Four times a day (QID) | ORAL | Status: DC | PRN

## 2012-09-05 NOTE — Progress Notes (Signed)
[facility] PROGRESS NOTE  Dwayne Stuart:[id number] DOB: February 06, 1915 DOA: 09/04/2012 PCP: Kimber Relic, MD  Interim history 79 year old gentleman with a history of diastolic CHF, mild dysplastic syndrome, pernicious anemia presented with shortness of breath and generalized weakness. The patient is a poor historian. Some of this history is obtained from the patient's wife who is also a poor historian. The patient has no previous history of cigarette use, but used to smoke a pipe in college. The patient's wife relates a one week history of poor by mouth intake, no energy. As a result, she called the nurse to come and evaluate the patient at [facility]. At the time of evaluation, the patient was found to be hypoxemic with oxygen saturation in the 80s. As a result, the patient was sent to the emergency department for further evaluation. Chest x-ray showed stable severe interstitial lung disease with mild cardiomegaly. CT of the chest revealed a subpleural reticulation and fibrosis with possible honeycombing. Initially, the patient was found to be wheezing. He was started on intravenous Solu-Medrol with improvement of his breathing and wheezing. Solu-Medrol was changed to oral prednisone. Echocardiogram was obtained. The patient was started on levofloxacin 500 mg by mouth every 48 hours. His anemia was worked up, but was likely due to his myelodysplastic syndrome. ProBNP was elevated at 1576, but the patient was not clinically volume overloaded. Assessment/Plan: Acute respiratory failure/hypoxemia -Suspect underlying interstitial lung disease/usual interstitial pneumonia (UIP)based upon CT and chest x-ray findings -Patient's wheezing has improved with IV steroids-->po steroids -Symptomatically he feels better -Weaned to oral steroids -five-day course of levofloxacin--first dose give night of 09/04/12 -Echocardiogram-->?pulm HTN/cor pulmonale -Bronchodilators when necessary shortness of  breath -Ambulatory pulse ox check Chronic diastolic CHF -Echocardiogram as discussed above -Clinically not fluid overloaded Normocytic anemia -Check iron, TIBC, ferritin -Serum B12, RBC folate Myelodysplastic syndrome -Patient has chronic pancytopenia -Patient receives Anranesp as outpt Deconditioning -PT evaluation Elevated proBNP  -Clinically does not appear fluid overloaded  -Check echocardiogram  Severe protein calorie malnutrition  -Ensure     Family Communication:   Pt at beside Disposition Plan:   [facility]    Antibiotics:  Levofloxacin 09/04/12>>>    Procedures/Studies: Dg Chest 2 View  09/04/2012   *RADIOLOGY REPORT*  Clinical Data: Shortness of breath, weakness.  CHEST - 2 VIEW  Comparison: 06/02/2010  Findings: Cardiomegaly.  Diffuse chronic changes throughout the lungs compatible chronic interstitial lung disease.  No change since prior study.  No definite acute process.  No visible effusions or acute bony abnormality.  Degenerative changes in the shoulders.  IMPRESSION: Stable severe chronic interstitial lung disease.  Mild cardiomegaly.  No definite acute process.   Original Report Authenticated By: Charlett Nose, M.D.   Ct Chest Wo Contrast  09/05/2012   *RADIOLOGY REPORT*  Clinical Data: Chronic lung disease, weakness, cough, shortness of breath  CT CHEST WITHOUT CONTRAST  Technique:  Multidetector CT imaging of the chest was performed following the standard protocol without IV contrast.  Comparison: Chest radiographs dated 08/25/2012  Findings: Evaluation is constrained by respiratory motion.  Subpleural reticulation/fibrosis with possible honeycombing in the right lower lung, compatible with moderate chronic interstitial lung disease, possibly reflecting a UIP pattern.  No superimposed opacities suspicious for pneumonia. No suspicious pulmonary nodules. No pleural effusion or pneumothorax.  Possible tracheobronchomegaly.  Cardiomegaly.  No  pericardial effusion.  Coronary atherosclerosis. Atherosclerotic calcifications of the aortic arch.  Small mediastinal lymph nodes which do not meet pathologic CT size criteria, likely  reactive.  No suspicious axillary lymphadenopathy.  Visualized upper abdomen is notable for vascular calcifications and cholecystectomy clips.  Degenerative changes of the visualized thoracolumbar spine.  IMPRESSION: Moderate chronic interstitial lung disease, as described above, possibly reflecting a UIP pattern.  No superimposed opacities suspicious for pneumonia.  Possible tracheobronchomegaly.   Original Report Authenticated By: Charline Bills, M.D.         Subjective:  patient denies any fevers, chills, chest discomfort, shortness breath, nausea, vomiting, diarrhea, abdominal pain, dysuria, hematuria. No rashes. No headache. No dizziness.  Objective: Filed Vitals:   09/04/12 2152 09/05/12 0110 09/05/12 0703 09/05/12 1325  BP:  129/64 110/45 117/55  Pulse:  82 92 76  Temp:  97.5 F (36.4 C) 97.2 F (36.2 C) 98.2 F (36.8 C)  TempSrc:  Oral Oral Oral  Resp:  28 28 18   Height:  5\' 8"  (1.727 m)    Weight:  62.687 kg (138 lb 3.2 oz)    SpO2: 95% 95% 93% 95%    Intake/Output Summary (Last 24 hours) at 09/05/12 1443 Last data filed at 09/05/12 0930  Gross per 24 hour  Intake    120 ml  Output    125 ml  Net     -5 ml   Weight change:  Exam:   General:  Pt is alert, follows commands appropriately, not in acute distress  HEENT: No icterus, No thrush,  [place]/AT  Cardiovascular: RRR, S1/S2, no rubs, no gallops  Respiratory: bilateral crackles. No wheezes or rhonchi. Air movement.   Abdomen: Soft/+BS, non tender, non distended, no guarding  Extremities: No edema, No lymphangitis, No petechiae, No rashes, no synovitis  Data Reviewed: Basic Metabolic Panel:  Recent Labs Lab 09/04/12 1804 09/05/12 0200  NA 138 135  K 4.9 4.7  CL 105 103  CO2 27 26  GLUCOSE 101* 129*  BUN 32* 33*   CREATININE 1.26 1.18  CALCIUM 9.0 9.3   Liver Function Tests:  Recent Labs Lab 09/05/12 0200  AST 17  ALT 12  ALKPHOS 72  BILITOT 0.6  PROT 6.9  ALBUMIN 3.0*   No results found for this basename: LIPASE, AMYLASE,  in the last 168 hours No results found for this basename: AMMONIA,  in the last 168 hours CBC:  Recent Labs Lab 09/04/12 1804 09/05/12 0200  WBC 2.3* 1.7*  HGB 8.8* 9.3*  HCT 28.2* 29.9*  MCV 96.2 95.8  PLT PLATELET CLUMPS NOTED ON SMEAR, COUNT APPEARS DECREASED 54*   Cardiac Enzymes:  Recent Labs Lab 09/04/12 1804 09/05/12 0200 09/05/12 0718 09/05/12 1310  TROPONINI <0.30 <0.30 <0.30 <0.30   BNP: No components found with this basename: POCBNP,  CBG: No results found for this basename: GLUCAP,  in the last 168 hours  Recent Results (from the past 240 hour(s))  MRSA PCR SCREENING     Status: None   Collection Time    09/05/12  2:23 AM      Result Value Range Status   MRSA by PCR NEGATIVE  NEGATIVE Final   Comment:            The GeneXpert MRSA Assay (FDA     approved for NASAL specimens     only), is one component of a     comprehensive MRSA colonization     surveillance program. It is not     intended to diagnose MRSA     infection nor to guide or     monitor treatment for     MRSA  infections.     Scheduled Meds: . doxazosin  2 mg Oral Daily  . finasteride  5 mg Oral Daily  . folic acid  1 mg Oral Daily  . heparin  5,000 Units Subcutaneous Q8H  . levofloxacin (LEVAQUIN) IV  500 mg Intravenous Q24H  . levothyroxine  100 mcg Oral QAC breakfast  . methylPREDNISolone (SOLU-MEDROL) injection  40 mg Intravenous Q12H  . [START ON 09/06/2012] predniSONE  40 mg Oral Q breakfast  . sodium chloride  3 mL Intravenous Q12H  . sodium chloride  3 mL Intravenous Q12H   Continuous Infusions:    Lashana Spang, DO  [facility] Pager [phone]  If 7PM-7AM, please contact night-coverage [email or web address] Password [email or web address] 09/05/2012, 2:43 PM    LOS: 1 day

## 2012-09-05 NOTE — Progress Notes (Signed)
Clinical Social Work Department BRIEF PSYCHOSOCIAL ASSESSMENT 09/05/2012  Patient:  Dwayne Stuart, Dwayne Stuart     Account Number:  [id number]     Admit date:  09/04/2012  Clinical Social Worker:  Dennison Bulla  Date/Time:  09/05/2012 11:30 AM  Referred by:  Physician  Date Referred:  09/05/2012 Referred for  SNF Placement   Other Referral:   Interview type:  Patient Other interview type:    PSYCHOSOCIAL DATA Living Status:  FACILITY Admitted from facility:  [facility] Level of care:  Independent Living Primary support name:  [name] Primary support relationship to patient:  SPOUSE Degree of support available:   Strong    CURRENT CONCERNS Current Concerns  Post-Acute Placement   Other Concerns:    SOCIAL WORK ASSESSMENT / PLAN CSW received a call from facility reporting that patient lives with wife in independent living. Facility reports they feel that patient could benefit from ST SNF prior to returning to his apartment and asked CSW to talk with patient regarding disposition.    CSW went to room and met with patient at bedside. CSW introduced myself and explained role. Patient reports he lives with his wife and is strong and independent. Patient reports he is not very mobile but does not believe he can benefit from rehab. Patient reports he will return home with wife and does not want ST SNF.    CSW spoke with facility and informed them of patient's decision. CSW is signing off but available if further needs arise.   Assessment/plan status:  Psychosocial Support/Ongoing Assessment of Needs Other assessment/ plan:   Information/referral to community resources:   SNF information at facility    PATIENT'S/FAMILY'S RESPONSE TO PLAN OF CARE: Patient alert and oriented. Patient agreeable to assessment but refusing ST SNF at this time. Patient reports no further needs.       (Coverage for Unice Bailey)

## 2012-09-05 NOTE — Progress Notes (Signed)
  Echocardiogram 2D Echocardiogram has been performed.  Dwayne Stuart 09/05/2012, 3:10 PM

## 2012-09-06 DIAGNOSIS — E875 Hyperkalemia: Secondary | ICD-10-CM

## 2012-09-06 DIAGNOSIS — I272 Pulmonary hypertension, unspecified: Secondary | ICD-10-CM | POA: Diagnosis present

## 2012-09-06 DIAGNOSIS — I420 Dilated cardiomyopathy: Secondary | ICD-10-CM | POA: Diagnosis present

## 2012-09-06 DIAGNOSIS — I5040 Unspecified combined systolic (congestive) and diastolic (congestive) heart failure: Secondary | ICD-10-CM

## 2012-09-06 DIAGNOSIS — I428 Other cardiomyopathies: Secondary | ICD-10-CM

## 2012-09-06 DIAGNOSIS — I2789 Other specified pulmonary heart diseases: Secondary | ICD-10-CM

## 2012-09-06 DIAGNOSIS — R0602 Shortness of breath: Secondary | ICD-10-CM

## 2012-09-06 LAB — CBC
HCT: 30.5 % — ABNORMAL LOW (ref 39.0–52.0)
Hemoglobin: 9.5 g/dL — ABNORMAL LOW (ref 13.0–17.0)
MCV: 95.9 fL (ref 78.0–100.0)
RDW: 18.9 % — ABNORMAL HIGH (ref 11.5–15.5)
WBC: 2.7 10*3/uL — ABNORMAL LOW (ref 4.0–10.5)

## 2012-09-06 LAB — IRON AND TIBC
Saturation Ratios: 85 % — ABNORMAL HIGH (ref 20–55)
TIBC: 212 ug/dL — ABNORMAL LOW (ref 215–435)

## 2012-09-06 LAB — BASIC METABOLIC PANEL
BUN: 43 mg/dL — ABNORMAL HIGH (ref 6–23)
CO2: 27 mEq/L (ref 19–32)
Chloride: 104 mEq/L (ref 96–112)
Creatinine, Ser: 1.23 mg/dL (ref 0.50–1.35)
Potassium: 5.3 mEq/L — ABNORMAL HIGH (ref 3.5–5.1)

## 2012-09-06 LAB — FERRITIN: Ferritin: 496 ng/mL — ABNORMAL HIGH (ref 22–322)

## 2012-09-06 MED ORDER — LEVOFLOXACIN 500 MG PO TABS: 500.0000 mg | ORAL_TABLET | ORAL | Status: DC

## 2012-09-06 MED ORDER — CARVEDILOL 3.125 MG PO TABS: 3.1250 mg | ORAL_TABLET | Freq: Two times a day (BID) | ORAL | Status: DC

## 2012-09-06 MED ORDER — SODIUM POLYSTYRENE SULFONATE 15 GM/60ML PO SUSP
30.0000 g | Freq: Once | ORAL | Status: AC
  Administered 2012-09-06: 30 g via ORAL
  Filled 2012-09-06: qty 120

## 2012-09-06 MED ORDER — PREDNISONE 20 MG PO TABS: 40.0000 mg | ORAL_TABLET | Freq: Every day | ORAL | Status: DC

## 2012-09-06 MED ORDER — LISINOPRIL 10 MG PO TABS
10.0000 mg | ORAL_TABLET | Freq: Every day | ORAL | Status: DC
  Administered 2012-09-06: 10 mg via ORAL
  Filled 2012-09-06: qty 1

## 2012-09-06 MED ORDER — METOPROLOL TARTRATE 12.5 MG HALF TABLET
12.5000 mg | ORAL_TABLET | Freq: Two times a day (BID) | ORAL | Status: DC
  Administered 2012-09-06: 12.5 mg via ORAL
  Filled 2012-09-06 (×2): qty 1

## 2012-09-06 NOTE — Progress Notes (Signed)
Chaplain saw pt while rounding on 4 west.  Introduced spiritual care as resource.  Provided brief support and engaged in life review with pt.  Pt in good spirits and expressed no needs at time.    SW later informed chaplain that pt not wishing to go to skilled nursing for rehab.  Chaplain will follow up with pt for support as admitted.    Belva Crome MDiv

## 2012-09-06 NOTE — Evaluation (Signed)
Physical Therapy Evaluation Patient Details Name: Dwayne Stuart MRN: [id number] DOB: February 01, 1915 Today's Date: 09/06/2012 Time: 4696-2952 PT Time Calculation (min): 17 min  PT Assessment / Plan / Recommendation Clinical Impression  Pt presents with acute respiratory failure with history of CHF and myelodysplastic syndrome.  Per pt and chart, he does not ambulate very much at home, but he uses motorized scooter and pivots from chair/toilet to scooter/chair.  Tolerated OOB to chair same way with RW, however declined trying to ambulate.  Pt will benefit from skilled PT in acute venue to address deficits.  PT recommends ST SNF, however feel that pt will decline, therefore may benefit from ALF, where more assist can be provided with HHPT in place to address deficits.      PT Assessment  Patient needs continued PT services    Follow Up Recommendations  SNF;Supervision/Assistance - 24 hour (or HHPT in ALF if pt willing)    Does the patient have the potential to tolerate intense rehabilitation      Barriers to Discharge Decreased caregiver support      Equipment Recommendations  None recommended by PT    Recommendations for Other Services OT consult   Frequency Min 3X/week    Precautions / Restrictions Precautions Precautions: Fall Restrictions Weight Bearing Restrictions: No   Pertinent Vitals/Pain No pain      Mobility  Bed Mobility Bed Mobility: Supine to Sit Supine to Sit: 4: Min guard;HOB elevated Details for Bed Mobility Assistance: Min/guard assist to ensure safety of trunk with cues for technique.  Transfers Transfers: Sit to Stand;Stand to Sit;Stand Pivot Transfers Sit to Stand: 3: Mod assist;From elevated surface;With upper extremity assist;From bed Stand to Sit: 4: Min assist;With upper extremity assist;With armrests;To chair/3-in-1 Stand Pivot Transfers: 3: Mod assist Details for Transfer Assistance: Assist to rise and steady with cues for hand placement and safety.   Pt would only agree to perform stand pivot to get to chair, as this is what he does at home to get to chair or scooter.  cues for sequencing/technique with RW.  Ambulation/Gait Ambulation/Gait Assistance: Not tested (comment) Assistive device: Rolling walker Stairs: No Wheelchair Mobility Wheelchair Mobility: No    Exercises     PT Diagnosis: Difficulty walking;Generalized weakness  PT Problem List: Decreased strength;Decreased activity tolerance;Decreased balance;Decreased mobility;Decreased coordination;Decreased knowledge of use of DME;Decreased safety awareness;Decreased knowledge of precautions PT Treatment Interventions: DME instruction;Gait training;Functional mobility training;Therapeutic activities;Therapeutic exercise;Balance training;Patient/family education   PT Goals Acute Rehab PT Goals PT Goal Formulation: With patient Time For Goal Achievement: 09/13/12 Potential to Achieve Goals: Good Pt will go Supine/Side to Sit: with supervision PT Goal: Supine/Side to Sit - Progress: Goal set today Pt will go Sit to Supine/Side: with supervision PT Goal: Sit to Supine/Side - Progress: Goal set today Pt will go Sit to Stand: with supervision PT Goal: Sit to Stand - Progress: Goal set today Pt will go Stand to Sit: with supervision PT Goal: Stand to Sit - Progress: Goal set today Pt will Transfer Bed to Chair/Chair to Bed: with supervision PT Transfer Goal: Bed to Chair/Chair to Bed - Progress: Goal set today Pt will Ambulate: 1 - 15 feet;with min assist;with least restrictive assistive device PT Goal: Ambulate - Progress: Goal set today  Visit Information  Last PT Received On: 09/06/12 Assistance Needed: +1    Subjective Data  Subjective: I've had a good life Patient Stated Goal: to return home with wife.    Prior Functioning  Home Living Lives With: Spouse  Type of Home: Independent living facility Home Access: Level entry Home Layout: One level Bathroom Shower/Tub:  Health visitor: Handicapped height Home Adaptive Equipment: Walker - rolling;Wheelchair - powered Prior Function Level of Independence: Independent with assistive device(s) Able to Take Stairs?: No Driving: No Vocation: Retired Musician: Surveyor, mining Arousal/Alertness: Awake/alert Overall Cognitive Status: No family/caregiver present to determine baseline cognitive functioning    Extremity/Trunk Assessment Right Lower Extremity Assessment RLE ROM/Strength/Tone: Deficits RLE ROM/Strength/Tone Deficits: Pt with generalized weakness, grossly 3/5 per functional assessment RLE Sensation: WFL - Light Touch Left Lower Extremity Assessment LLE ROM/Strength/Tone: Deficits LLE ROM/Strength/Tone Deficits: Pt with generalized weakness, grossly 3/5 per functional assessment.  LLE Sensation: WFL - Light Touch Trunk Assessment Trunk Assessment: Kyphotic   Balance    End of Session PT - End of Session Equipment Utilized During Treatment: Gait belt Activity Tolerance: Patient limited by fatigue Patient left: in chair;with call bell/phone within reach Nurse Communication: Mobility status  GP     Vista Deck 09/06/2012, 3:59 PM

## 2012-09-06 NOTE — Progress Notes (Signed)
Discharged home with husband.

## 2012-09-06 NOTE — Progress Notes (Signed)
CSW received call for ambulance home. CSW informed patient that patient insurance may not cover and he may have to pay privately. Patient stated he would rather fight iwht insurnace or pay privately and prefers to dishcarge home. CSW discussed with Interior and spatial designer. Ptar called and informed that patient is aware he would be paying privately at this time, and can follow up with insurance.   Catha Gosselin, LCSWA  [phone] .09/06/2012 1807pm

## 2012-09-06 NOTE — Plan of Care (Signed)
Problem: Phase I Progression Outcomes Goal: OOB as tolerated unless otherwise ordered Outcome: Completed/Met Date Met:  09/06/12 With assist only

## 2012-09-06 NOTE — Consult Note (Signed)
Patient ID: Dwayne Stuart MRN: [id number] DOB/AGE: 10-12-1914 79 y.o.  Admit date: 09/04/2012 Referring Physician: Onalee Hua Tat Primary Cardiologist: New Reason for Consultation: Abnormal echo, reduced LVEF  HPI: 79 yo male with history of myelodysplastic syndrome, hypothyroidism, pernicious anemia, BPH, OA who is admitted to [facility] with SOB and weakness. He is felt to have underlying lung disease and is being treated with steroids, antibiotics. Echocardiogram 09/05/12 with severe LV systolic dysfunction, LVEF 25-30%. Estimated RVSP 80 mmHg. No significant valvular disease.   He tells me that he feels well. He denies any chest pain or SOB. No other complaints. Notes that breathing is easier after treatment he has received over last 24 hours.   Past Medical History  Diagnosis Date  . Anemia, unspecified   . 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 Hypertrophy of prostate without urinary obstruction and other lower urinary tract symptoms (LUTS)   . Osteoarthrosis, unspecified whether generalized or localized, unspecified site   . Abnormality of gait   . Dysphagia, unspecified(787.20)   . Stiffness of joints, not elsewhere classified, multiple sites   . Unspecified hypothyroidism   . Hemorrhoids   . Pain in joint, site unspecified   . Other malaise and fatigue   . Slowing of urinary stream   . Pernicious anemia 03/08/2011  . Hypothyroidism 03/08/2011  . BPH (benign prostatic hypertrophy) 03/08/2011  . History of Zenker's diverticulum removal 03/08/2011  . Degenerative arthritis of lumbar spine 03/08/2011    Family History  Problem Relation Age of Onset  . Heart disease Father     History   Social History  . Marital Status: Married    Spouse Name: N/A    Number of Children: N/A  . Years of Education: N/A   Occupational History  . retired Environmental manager    Social History Main Topics  . Smoking status: Never Smoker   .  Smokeless tobacco: Not on file  . Alcohol Use: No  . Drug Use: No  . Sexually Active: No   Other Topics Concern  . Not on file   Social History Narrative  . No narrative on file    Past Surgical History  Procedure Laterality Date  . Tonsillectomy    . Cholecystectomy      No Known Allergies  Prescriptions prior to admission  Medication Sig Dispense Refill  . Cholecalciferol (VITAMIN D) 1000 UNITS capsule Take 1,000 Units by mouth daily.        Marland Kitchen doxazosin (CARDURA) 2 MG tablet Take 2 mg by mouth daily.       . finasteride (PROSCAR) 5 MG tablet Take 5 mg by mouth daily.      . folic acid (FOLVITE) 1 MG tablet Take 1 mg by mouth daily.      Marland Kitchen levothyroxine (SYNTHROID, LEVOTHROID) 100 MCG tablet Take 100 mcg by mouth daily before breakfast.      . naproxen sodium (ANAPROX) 220 MG tablet Take 440 mg by mouth 2 (two) times daily as needed (for pain).       Marland Kitchen PROAIR HFA 108 (90 BASE) MCG/ACT inhaler Inhale 2 puffs into the lungs every 4 (four) hours as needed for wheezing or shortness of breath.         Review of systems complete and found to be negative unless listed above    Physical Exam: Blood pressure 135/54, pulse 63, temperature 98.1 F (36.7 C), temperature source Oral, resp. rate 18, height 5'  8" (1.727 m), weight 136 lb 12.8 oz (62.052 kg), SpO2 97.00%.    General: Eldery white male, Alert and oriented. NAD  HEENT: OP clear, mucus membranes moist  SKIN: warm, dry. No rashes.  Neuro: No focal deficits  Musculoskeletal: Muscle strength 5/5 all ext  Psychiatric: Mood and affect normal  Neck: No JVD, no carotid bruits, no thyromegaly, no lymphadenopathy.  Lungs:Fine crackles throughout. No wheezes, rhonci, crackles  Cardiovascular: Regular rate and rhythm. No murmurs, gallops or rubs.  Abdomen:Soft. Bowel sounds present. Non-tender.  Extremities: No lower extremity edema. Pulses are 2 + in the bilateral DP/PT.   Labs:   Lab Results  Component Value Date   WBC 2.7*  09/06/2012   HGB 9.5* 09/06/2012   HCT 30.5* 09/06/2012   MCV 95.9 09/06/2012   PLT 50* 09/06/2012    Recent Labs Lab 09/05/12 0200 09/06/12 0548  NA 135 137  K 4.7 5.3*  CL 103 104  CO2 26 27  BUN 33* 43*  CREATININE 1.18 1.23  CALCIUM 9.3 9.3  PROT 6.9  --   BILITOT 0.6  --   ALKPHOS 72  --   ALT 12  --   AST 17  --   GLUCOSE 129* 150*   Lab Results  Component               TROPONINI <0.30 09/05/2012    Echo 09/05/12:  Left ventricle: The cavity size was normal. Wall thickness was normal. Systolic function was severely reduced. The estimated ejection fraction was in the range of 25% to 30%. Akinesis of the anterior, anterolateral, inferolateral, and apical myocardium. Severe hypokinesis of the inferior myocardium. - Mitral valve: Mild regurgitation. - Atrial septum: No defect or patent foramen ovale was identified. - Pulmonary arteries: Systolic pressure was severely increased. PA peak pressure: 80mm Hg (S).  EKG: sinus, LBBB  ASSESSMENT AND PLAN:   1. Cardiomyopathy: ? Etiology. CT chest without evidence of pulmonary edema. He is not known to have CAD. He has had no chest pain. No evidence of volume overload. Given advanced age and pancytopenia, would not pursue invasive workup with cardiac cath. He tells me that he is very comfortable and has no desire at his age to even consider an invasive workup. Would consider addition of low dose beta blocker while here to see how he tolerates. (Coreg 3.125 mg po BID). Could add Ace-inhibitor (Lisinopril 2.5 mg po Qdaily) if he tolerates the beta blocker.   2. Pulmonary HTN: Likely related to underlying lung disease. Given advanced age, would not pursue further invasive workup at this time. The patient is in agreement with this plan.    Signed: Dessie Tatem 09/06/2012, 3:13 PM

## 2012-09-06 NOTE — Discharge Summary (Addendum)
Physician Discharge Summary  MARSHALL KAMPF ZOX:[id number] DOB: [birth year] DOA: 09/04/2012  PCP: Kimber Relic, MD  Admit date: 09/04/2012 Discharge date: 09/06/2012  Time spent: 40 minutes  Recommendations for Outpatient Follow-up:  [facility] to [facility]. Needs HHPT  Discharge Diagnoses:   Principal Problem:   Acute respiratory failure  Active Problems:   Combined congestive systolic and diastolic heart failure   Chronic interstitial lung disease   Myelodysplastic syndrome   Severe protein-calorie malnutrition   Pulmonary HTN   Congestive dilated cardiomyopathy   Discharge Condition: FAIR  Diet recommendation: regular  Filed Weights   09/05/12 0110 09/06/12 0445  Weight: 62.687 kg (138 lb 3.2 oz) 62.052 kg (136 lb 12.8 oz)    History of present illness:  79 year old male  with a history of diastolic CHF, mild dysplastic syndrome, pernicious anemia presented with shortness of breath and generalized weakness.  The patient's wife relates a one week history of poor by mouth intake, no energy. As a result, she called the nurse to come and evaluate the patient at [facility]. At the time of evaluation, the patient was found to be hypoxemic with oxygen saturation in the 80s. As a result, the patient was sent to the emergency department for further evaluation. Chest x-ray showed stable severe interstitial lung disease with mild cardiomegaly. CT of the chest revealed a subpleural reticulation and fibrosis with possible honeycombing. Initially, the patient was found to be wheezing. He was started on intravenous Solu-Medrol with improvement of his breathing and wheezing. Solu-Medrol was changed to oral prednisone. Echocardiogram was obtained. The patient was started on levofloxacin 500 mg by mouth every 48 hours. His anemia was worked up, but was likely due to his myelodysplastic syndrome. ProBNP was elevated at 1576. patient admitted to medical floor on telemetry.     Hospital Course:   Acute respiratory failure/hypoxemia  -Suspect underlying interstitial lung disease based  upon CT and chest x-ray findings  -Patient's wheezing has improved with IV steroids which is now transitioned to oral prednisone. -Symptomatically he feels better . sats sable on RA. No wheezing on exam today.  -Weaned to oral steroids and discharge on a short course of po prednisone. Discharge on five-day course of levofloxacin.  Systolic and diastolic CHF  hx of diastolic dysfunction. Elevated pro BNP noted. On repeat Echo his EF is markedly reduced to 25-30% with severe hypokinesis of inferior myocardium. Cannot r/o recent ischemia. Seen by cardiology consult . patient does not wish for further aggressive therapy including cardiac cath given his advanced age. Added low  dose coreg. His k is 5.3 today so held off adding ACEi or ARB. If K level improved and BP stable on follow up visit, he can be started on low dose ACEi.    Myelodysplastic syndrome  -Patient has chronic pancytopenia  -Patient receives Anranesp as outpt   Deconditioning  -PT evaluation , recommend HHPT vs SNF.    Severe protein calorie malnutrition  -Ensure  supplements  Hyperkalemia  given 30 gm kalexalate. Check k as outpt  Procedures: None  Consultations:  [facility]  Discharge Exam: Filed Vitals:   09/05/12 2039 09/06/12 0445 09/06/12 1030 09/06/12 1353  BP: 124/60 115/54 132/78 135/54  Pulse: 74 69 70 63  Temp: 97.5 F (36.4 C) 97.6 F (36.4 C)  98.1 F (36.7 C)  TempSrc: Oral Oral  Oral  Resp: 20 24 20 18   Height:      Weight:  62.052 kg (136 lb 12.8 oz)  SpO2: 100% 98%  97%    General: Very male in no acute distress HEENT: No pallor, moist oral mucosa Chest: Bibasilar coarse crackles Cardiovascular: Normal S1 and S2, no murmurs rub or gallop Abdomen: Soft, nontender, nondistended, bowel sounds present Extremities: Warm, no edema CNS: AAO x3   Discharge  Instructions   Future Appointments Provider Department Dept Phone   09/18/2012 1:15 PM Delcie Roch [facility] [phone]   09/18/2012 1:45 PM Rana Snare, NP [facility] MEDICAL ONCOLOGY [phone]   09/18/2012 2:15 PM [facility] [place] [facility] [phone]   09/28/2012 3:15 PM Kimber Relic, MD [facility] [phone]   10/09/2012 1:30 PM Windell Hummingbird [facility] [phone]   10/09/2012 2:00 PM [facility] Nurse [place] [facility] [phone]   10/30/2012 1:30 PM Windell Hummingbird [place] [facility] [phone]   10/30/2012 2:00 PM [facility] [place] [facility] [phone]       Medication List    TAKE these medications       carvedilol 3.125 MG tablet  Commonly known as:  COREG  Take 1 tablet (3.125 mg total) by mouth 2 (two) times daily with a meal.     doxazosin 2 MG tablet  Commonly known as:  CARDURA  Take 2 mg by mouth daily.     finasteride 5 MG tablet  Commonly known as:  PROSCAR  Take 5 mg by mouth daily.     folic acid 1 MG tablet  Commonly known as:  FOLVITE  Take 1 mg by mouth daily.     levothyroxine 100 MCG tablet  Commonly known as:  SYNTHROID, LEVOTHROID  Take 100 mcg by mouth daily before breakfast.     naproxen sodium 220 MG tablet  Commonly known as:  ANAPROX  Take 440 mg by mouth 2 (two) times daily as needed (for pain).     predniSONE 20 MG tablet  Commonly known as:  DELTASONE  Take 2 tablets (40 mg total) by mouth daily. For 5 days     PROAIR HFA 108 (90 BASE) MCG/ACT inhaler  Generic drug:  albuterol  Inhale 2 puffs into the lungs every 4 (four) hours as needed for wheezing or shortness of breath.     Vitamin D 1000 UNITS capsule  Take 1,000 Units by mouth daily.       Tab levofloxacin 500 mg   1 tablet q 48 hours  for 3 days   No Known Allergies     Follow-up Information   Follow up with GREEN, Lenon Curt, MD In 1 week. (needs monitoring of K level on follow up)    Contact information:   [address] [place] [address] [phone]        The results of significant diagnostics from this hospitalization (including imaging, microbiology, ancillary and laboratory) are listed below for reference.    Significant Diagnostic Studies: Dg Chest 2 View  09/04/2012   *RADIOLOGY REPORT*  Clinical Data: Shortness of breath, weakness.  CHEST - 2 VIEW  Comparison: 06/02/2010  Findings: Cardiomegaly.  Diffuse chronic changes throughout the lungs compatible chronic interstitial lung disease.  No change since prior study.  No definite acute process.  No visible effusions or acute bony abnormality.  Degenerative changes in the shoulders.  IMPRESSION: Stable severe chronic interstitial lung disease.  Mild cardiomegaly.  No definite  acute process.   Original Report Authenticated By: Charlett Nose, M.D.   Ct Chest Wo Contrast  09/05/2012   *RADIOLOGY REPORT*  Clinical Data: Chronic lung disease, weakness, cough, shortness of breath  CT CHEST WITHOUT CONTRAST  Technique:  Multidetector CT imaging of the chest was performed following the standard protocol without IV contrast.  Comparison: Chest radiographs dated 08/25/2012  Findings: Evaluation is constrained by respiratory motion.  Subpleural reticulation/fibrosis with possible honeycombing in the right lower lung, compatible with moderate chronic interstitial lung disease, possibly reflecting a UIP pattern.  No superimposed opacities suspicious for pneumonia. No suspicious pulmonary nodules. No pleural effusion or pneumothorax.  Possible tracheobronchomegaly.  Cardiomegaly.  No pericardial effusion.  Coronary atherosclerosis. Atherosclerotic calcifications of the aortic arch.  Small mediastinal lymph nodes which do not meet pathologic CT size criteria, likely reactive.   No suspicious axillary lymphadenopathy.  Visualized upper abdomen is notable for vascular calcifications and cholecystectomy clips.  Degenerative changes of the visualized thoracolumbar spine.  IMPRESSION: Moderate chronic interstitial lung disease, as described above, possibly reflecting a UIP pattern.  No superimposed opacities suspicious for pneumonia.  Possible tracheobronchomegaly.   Original Report Authenticated By: Charline Bills, M.D.    Microbiology: Recent Results (from the past 240 hour(s))  MRSA PCR SCREENING     Status: None   Collection Time    09/05/12  2:23 AM      Result Value Range Status   MRSA by PCR NEGATIVE  NEGATIVE Final   Comment:            The GeneXpert MRSA Assay (FDA     approved for NASAL specimens     only), is one component of a     comprehensive MRSA colonization     surveillance program. It is not     intended to diagnose MRSA     infection nor to guide or     monitor treatment for     MRSA infections.     Labs: Basic Metabolic Panel:  Recent Labs Lab 09/04/12 1804 09/05/12 0200 09/06/12 0548  NA 138 135 137  K 4.9 4.7 5.3*  CL 105 103 104  CO2 27 26 27   GLUCOSE 101* 129* 150*  BUN 32* 33* 43*  CREATININE 1.26 1.18 1.23  CALCIUM 9.0 9.3 9.3   Liver Function Tests:  Recent Labs Lab 09/05/12 0200  AST 17  ALT 12  ALKPHOS 72  BILITOT 0.6  PROT 6.9  ALBUMIN 3.0*   No results found for this basename: LIPASE, AMYLASE,  in the last 168 hours No results found for this basename: AMMONIA,  in the last 168 hours CBC:  Recent Labs Lab 09/04/12 1804 09/05/12 0200 09/06/12 0548  WBC 2.3* 1.7* 2.7*  HGB 8.8* 9.3* 9.5*  HCT 28.2* 29.9* 30.5*  MCV 96.2 95.8 95.9  PLT PLATELET CLUMPS NOTED ON SMEAR, COUNT APPEARS DECREASED 54* 50*   Cardiac Enzymes:  Recent Labs Lab 09/04/12 1804 09/05/12 0200 09/05/12 0718 09/05/12 1310  TROPONINI <0.30 <0.30 <0.30 <0.30   BNP: BNP (last 3 results)  Recent Labs  09/04/12 1804  PROBNP  1576.0*   CBG: No results found for this basename: GLUCAP,  in the last 168 hours     Signed:  Eddie North  [facility] 09/06/2012, 4:24 PM

## 2012-09-07 ENCOUNTER — Emergency Department (HOSPITAL_COMMUNITY)
Admission: EM | Admit: 2012-09-07 | Discharge: 2012-09-07 | Disposition: A | Discharge status: Skilled Nursing Facility | Payer: Medicare Other | Attending: Emergency Medicine | Admitting: Emergency Medicine

## 2012-09-07 ENCOUNTER — Emergency Department (HOSPITAL_COMMUNITY): Payer: Medicare Other

## 2012-09-07 ENCOUNTER — Encounter (HOSPITAL_COMMUNITY): Payer: Self-pay | Admitting: Emergency Medicine

## 2012-09-07 DIAGNOSIS — E039 Hypothyroidism, unspecified: Secondary | ICD-10-CM | POA: Insufficient documentation

## 2012-09-07 DIAGNOSIS — Z87448 Personal history of other diseases of urinary system: Secondary | ICD-10-CM | POA: Insufficient documentation

## 2012-09-07 DIAGNOSIS — M5137 Other intervertebral disc degeneration, lumbosacral region: Secondary | ICD-10-CM | POA: Insufficient documentation

## 2012-09-07 DIAGNOSIS — Z8679 Personal history of other diseases of the circulatory system: Secondary | ICD-10-CM | POA: Insufficient documentation

## 2012-09-07 DIAGNOSIS — Z8739 Personal history of other diseases of the musculoskeletal system and connective tissue: Secondary | ICD-10-CM | POA: Insufficient documentation

## 2012-09-07 DIAGNOSIS — Z8719 Personal history of other diseases of the digestive system: Secondary | ICD-10-CM | POA: Insufficient documentation

## 2012-09-07 DIAGNOSIS — I959 Hypotension, unspecified: Secondary | ICD-10-CM | POA: Insufficient documentation

## 2012-09-07 DIAGNOSIS — D469 Myelodysplastic syndrome, unspecified: Secondary | ICD-10-CM | POA: Insufficient documentation

## 2012-09-07 DIAGNOSIS — N179 Acute kidney failure, unspecified: Secondary | ICD-10-CM | POA: Insufficient documentation

## 2012-09-07 DIAGNOSIS — M1990 Unspecified osteoarthritis, unspecified site: Secondary | ICD-10-CM | POA: Insufficient documentation

## 2012-09-07 DIAGNOSIS — Z79899 Other long term (current) drug therapy: Secondary | ICD-10-CM | POA: Insufficient documentation

## 2012-09-07 DIAGNOSIS — D649 Anemia, unspecified: Secondary | ICD-10-CM | POA: Insufficient documentation

## 2012-09-07 DIAGNOSIS — Z85828 Personal history of other malignant neoplasm of skin: Secondary | ICD-10-CM | POA: Insufficient documentation

## 2012-09-07 DIAGNOSIS — M51379 Other intervertebral disc degeneration, lumbosacral region without mention of lumbar back pain or lower extremity pain: Secondary | ICD-10-CM | POA: Insufficient documentation

## 2012-09-07 LAB — BASIC METABOLIC PANEL
BUN: 64 mg/dL — ABNORMAL HIGH (ref 6–23)
Chloride: 100 mEq/L (ref 96–112)
Creatinine, Ser: 1.72 mg/dL — ABNORMAL HIGH (ref 0.50–1.35)
GFR calc Af Amer: 37 mL/min — ABNORMAL LOW (ref >90)
Glucose, Bld: 85 mg/dL (ref 70–99)
Potassium: 4.3 mEq/L (ref 3.5–5.1)

## 2012-09-07 MED ORDER — SODIUM CHLORIDE 0.9 % IV BOLUS (SEPSIS)
500.0000 mL | Freq: Once | INTRAVENOUS | Status: AC
  Administered 2012-09-07: 500 mL via INTRAVENOUS

## 2012-09-07 NOTE — ED Notes (Signed)
ZOX:[id number]<AV> Expected date:<BR> Expected time:<BR> Means of arrival:<BR> Comments:<BR> ems- 79 yo

## 2012-09-07 NOTE — Progress Notes (Signed)
Discharge summary sent to payer through [facility]  

## 2012-09-07 NOTE — ED Notes (Signed)
PER EMS- [facility] hypotension x1 day.  Reports that pt has been hospitalized x2 nights for pneumonia and was discharged last night. Prior to discharge pt was given lisinopril and lopressor.  Pt has hx of HTN.  Bp medication has been withheld since this am.  Upon discharge yesterday, pt BP was 80-systolic.  Current BP 85/50.  Pt alert and oriented and is asymptomatic.

## 2012-09-07 NOTE — ED Notes (Signed)
Pt refusing to provide [place] sample

## 2012-09-07 NOTE — ED Notes (Signed)
Patient states does not have to void at this time. Will re-attempt sample once fluids finish

## 2012-09-07 NOTE — ED Notes (Signed)
Patient transported to X-ray 

## 2012-09-07 NOTE — ED Provider Notes (Signed)
History     CSN: [id number]  Arrival date & time 09/07/12  1148   First MD Initiated Contact with Patient 09/07/12 1153      Chief Complaint  Patient presents with  . Hypotension    (Consider location/radiation/quality/duration/timing/severity/associated sxs/prior treatment) HPI Comments:  79 yo male with history of myelodysplastic syndrome, hypothyroidism, pernicious anemia, BPH, OA who was sent to the ER with cc of low BP. Pt was just discharged yday. New dx of CHF, started on low dose beta blocker. Pt had a home nurse visit today-  And the BP was low, so he was sent to the ER.  Pt denies nausea, emesis, fevers, chills, chest pains, shortness of breath, headaches, abdominal pain, uti like symptoms, or feeling dizzy/lightheaed. Pt had a UA and Urine culture - that was normal that visit.    The history is provided by the patient.    Past Medical History  Diagnosis Date  . Anemia, unspecified   . 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 Hypertrophy of prostate without urinary obstruction and other lower urinary tract symptoms (LUTS)   . Osteoarthrosis, unspecified whether generalized or localized, unspecified site   . Abnormality of gait   . Dysphagia, unspecified(787.20)   . Stiffness of joints, not elsewhere classified, multiple sites   . Unspecified hypothyroidism   . Hemorrhoids   . Pain in joint, site unspecified   . Other malaise and fatigue   . Slowing of urinary stream   . Pernicious anemia 03/08/2011  . Hypothyroidism 03/08/2011  . BPH (benign prostatic hypertrophy) 03/08/2011  . History of Zenker's diverticulum removal 03/08/2011  . Degenerative arthritis of lumbar spine 03/08/2011    Past Surgical History  Procedure Laterality Date  . Tonsillectomy    . Cholecystectomy      Family History  Problem Relation Age of Onset  . Heart disease Father     History  Substance Use Topics  . Smoking status: Never Smoker   .  Smokeless tobacco: Not on file  . Alcohol Use: No      Review of Systems  Constitutional: Negative for fever, chills and activity change.  HENT: Negative for neck pain.   Eyes: Negative for visual disturbance.  Respiratory: Negative for cough, chest tightness and shortness of breath.   Cardiovascular: Negative for chest pain.  Gastrointestinal: Negative for abdominal distention.  Genitourinary: Negative for dysuria, enuresis and difficulty urinating.  Musculoskeletal: Negative for arthralgias.  Neurological: Negative for dizziness, syncope, weakness, light-headedness and headaches.  Psychiatric/Behavioral: Negative for confusion.    Allergies  Review of patient's allergies indicates no known allergies.  Home Medications   Current Outpatient Rx  Name  Route  Sig  Dispense  Refill  . carvedilol (COREG) 3.125 MG tablet   Oral   Take 1 tablet (3.125 mg total) by mouth 2 (two) times daily with a meal.   60 tablet   0   . Cholecalciferol (VITAMIN D) 1000 UNITS capsule   Oral   Take 1,000 Units by mouth daily.           Marland Kitchen doxazosin (CARDURA) 2 MG tablet   Oral   Take 2 mg by mouth daily.          . finasteride (PROSCAR) 5 MG tablet   Oral   Take 5 mg by mouth daily.         . folic acid (FOLVITE) 1 MG tablet   Oral   Take 1  mg by mouth daily.         Marland Kitchen levofloxacin (LEVAQUIN) 500 MG tablet   Oral   Take 1 tablet (500 mg total) by mouth every other day.   2 tablet   0   . levothyroxine (SYNTHROID, LEVOTHROID) 100 MCG tablet   Oral   Take 100 mcg by mouth daily before breakfast.         . naproxen sodium (ANAPROX) 220 MG tablet   Oral   Take 440 mg by mouth 2 (two) times daily as needed (for pain).          . predniSONE (DELTASONE) 20 MG tablet   Oral   Take 2 tablets (40 mg total) by mouth daily.   10 tablet   0   . PROAIR HFA 108 (90 BASE) MCG/ACT inhaler   Inhalation   Inhale 2 puffs into the lungs every 4 (four) hours as needed for wheezing  or shortness of breath.            There were no vitals taken for this visit.  Physical Exam  Nursing note and vitals reviewed. Constitutional: He is oriented to person, place, and time. He appears well-developed.  HENT:  Head: Normocephalic and atraumatic.  Eyes: Conjunctivae and EOM are normal. Pupils are equal, round, and reactive to light.  Neck: Normal range of motion. Neck supple.  Cardiovascular: Normal rate and regular rhythm.   Pulmonary/Chest: Effort normal and breath sounds normal.  Abdominal: Soft. Bowel sounds are normal. He exhibits no distension. There is no tenderness. There is no rebound and no guarding.  Neurological: He is alert and oriented to person, place, and time.  Skin: Skin is warm.    ED Course  Procedures (including critical care time)  Labs Reviewed - No data to display No results found.   No diagnosis found.    MDM   Date: 09/07/2012  Rate: 70  Rhythm: normal sinus rhythm  QRS Axis: left  Intervals: normal  ST/T Wave abnormalities: nonspecific ST/T changes  Conduction Disutrbances:left bundle branch block  Narrative Interpretation:   Old EKG Reviewed: unchanged  DDx: Sepsis syndrome ACS syndrome CHF exacerbation - Cardiogenic Shock Infection - pneumonia/UTI/Cellulitis PE Dehydration Electrolyte abnormality Tox syndrome  Pt comes in with cc of low BP. He is asymptomatic. We will get basic labs and get lactate. No AMS is reassuring, no fevers, no signs of infection on exam and hx not suggestive of any infection either. Likely medication related or dehydration - will give a 500 cc bolus in the ED. Pt has advanced CHF, but there he is not cold and clammy, nor is he confused, hypoxic, so not thinking of the patient as Cardiogenic shock.  3:17 PM Labs show AKI. BP improved. Lactate is WNL.  I wanted patient to be admitted, as i think the AKI is in part from hypotension / prerenal cause. He doesn't want to get  admitted.  Patient wants to leave against medical advice. Patient understands that his/her actions will lead to inadequate medical workup, and that he/she is at risk of complications of missed diagnosis, which includes morbidity and mortality.  Patient is demonstrating good capacity to make decision. Patient understands that he/she needs to return to the ER immediately if his/her symptoms get worse.   Patient has a daily nurse visitation. I have asked him to stop his new beta blocker. He has a PCP visit next week.   Derwood Kaplan, MD 09/07/12 1520

## 2012-09-07 NOTE — ED Notes (Signed)
Unable to collect UA specimen at his time.  Pt unable to urinate.  Will continue to monitor.

## 2012-09-14 ENCOUNTER — Non-Acute Institutional Stay: Payer: Medicare Other | Admitting: Internal Medicine

## 2012-09-14 VITALS — BP 104/56 | HR 60 | Ht 68.0 in

## 2012-09-14 DIAGNOSIS — I5032 Chronic diastolic (congestive) heart failure: Secondary | ICD-10-CM

## 2012-09-14 DIAGNOSIS — I272 Pulmonary hypertension, unspecified: Secondary | ICD-10-CM

## 2012-09-14 DIAGNOSIS — J9600 Acute respiratory failure, unspecified whether with hypoxia or hypercapnia: Secondary | ICD-10-CM

## 2012-09-14 DIAGNOSIS — I509 Heart failure, unspecified: Secondary | ICD-10-CM

## 2012-09-14 DIAGNOSIS — I420 Dilated cardiomyopathy: Secondary | ICD-10-CM

## 2012-09-14 DIAGNOSIS — J8410 Pulmonary fibrosis, unspecified: Secondary | ICD-10-CM

## 2012-09-14 DIAGNOSIS — I5040 Unspecified combined systolic (congestive) and diastolic (congestive) heart failure: Secondary | ICD-10-CM

## 2012-09-14 DIAGNOSIS — I428 Other cardiomyopathies: Secondary | ICD-10-CM

## 2012-09-14 DIAGNOSIS — D469 Myelodysplastic syndrome, unspecified: Secondary | ICD-10-CM

## 2012-09-14 DIAGNOSIS — I2789 Other specified pulmonary heart diseases: Secondary | ICD-10-CM

## 2012-09-14 NOTE — Patient Instructions (Addendum)
Stop Carvedilol due to low blood pressures. Continue other medications. Try to increase caloric intake.

## 2012-09-14 NOTE — Progress Notes (Signed)
Subjective:    Patient ID: Dwayne Stuart, male    DOB: 1914/12/19, 79 y.o.   MRN: [id number]  HPI Patient was hospitalized 616 [id number] 09/06/2012. He had a significant hypotension. While hospitalized he was found to have combined systolic and diastolic congestive heart failure, pulmonary hypertension, congestive cardiomyopathy, myelodysplasia, and chronic interstitial lung disease. Patient's blood pressure did better after a couple of days and he was discharged back to his independent living apartment at [facility] where he resides with his wife. He remains quite weak, however he is able to be independent in bathroom, feeding himself, dressing. His wife assists with bathing.  Current Outpatient Prescriptions on File Prior to Visit  Medication Sig Dispense Refill  . Cholecalciferol (VITAMIN D) 1000 UNITS capsule Take 1,000 Units by mouth daily.        Marland Kitchen doxazosin (CARDURA) 2 MG tablet Take 2 mg by mouth daily.       . finasteride (PROSCAR) 5 MG tablet Take 5 mg by mouth daily.      . folic acid (FOLVITE) 1 MG tablet Take 1 mg by mouth daily.      Marland Kitchen levothyroxine (SYNTHROID, LEVOTHROID) 100 MCG tablet Take 100 mcg by mouth daily before breakfast.      . naproxen sodium (ANAPROX) 220 MG tablet Take 440 mg by mouth 2 (two) times daily as needed (for pain).       Marland Kitchen PROAIR HFA 108 (90 BASE) MCG/ACT inhaler Inhale 2 puffs into the lungs every 4 (four) hours as needed for wheezing or shortness of breath.       . carvedilol (COREG) 3.125 MG tablet Take 1 tablet (3.125 mg total) by mouth 2 (two) times daily with a meal.  60 tablet  0       Review of Systems DATA OBTAINED: from patient, medical record, wife  GENERAL: Feels weak. Has lost weight. Chronically fatigued. Has early satiety.SKIN: No itch, rash or open wounds EYES: No eye pain, dryness or itching  No change in vision EARS: No earache, tinnitus. Partial hearing loss NOSE: No congestion, drainage or bleeding MOUTH/THROAT: No mouth or  tooth pain  No sore throat No difficulty chewing or swallowing RESPIRATORY: No cough, wheezing. Short of breath with minimal exertion CARDIAC: No chest pain, palpitations  1+ edema bilaterally. GI: No abdominal pain  No N/V/D or constipation  No heartburn or reflux  GU: No dysuria, frequency or urgency  No change in urine volume or character No nocturia or change in stream   MUSCULOSKELETAL: No joint pain, swelling or stiffness  patient has chronic back discomfort. These are weak. He has difficulty when .  NEUROLOGIC: No dizziness, fainting, headache,  numbness  No change in mental status.  PSYCHIATRIC: No feelings of anxiety, depression Sleeps well.  No behavior issue.      Objective:   Physical Exam GENERAL APPEARANCE: No acute distress, appropriately groomed, normal body habitus. Alert, pleasant, conversant. HEAD: Normocephalic, atraumatic EYES: Conjunctiva/lids clear. Pupils round, reactive. EOMs intact. Wears corrective lenses. EARS: External exam WNL, canals clear, TM WNL. Hearing mildly diminished. NOSE: No deformity or discharge. MOUTH/THROAT: Lips w/o lesions. Oral mucosa, tongue moist, w/o lesion. Oropharynx w/o redness or lesions.  NECK: Supple, full ROM. No thyroid tenderness, enlargement or nodule LYMPHATICS: No head, neck or supraclavicular adenopathy RESPIRATORY: Breathing is even, unlabored. Bilateral dry rales present. CARDIOVASCULAR: Heart RRR. No murmur or extra heart sounds. 1+ pedal edema. GASTROINTESTINAL: Abdomen is soft, non-tender, not distended w/ normal bowel sounds. No hepatic  or splenic enlargement. No mass, ventral or inguinal hernia. MUSCULOSKELETAL: Moves all extremities with full ROM, strength and tone. Moderate kyphosis. Poor balance.  NEUROLOGIC: Oriented to time, place, person. Cranial nerves 2-12 grossly intact, speech clear, no tremor.  PSYCHIATRIC: Mood and affect appropriate to situation   Admission on 09/07/2012, Discharged on 09/07/2012   Component Date Value Range Status  . Sodium 09/07/2012 134* 135 - 145 mEq/L Final  . Potassium 09/07/2012 4.3  3.5 - 5.1 mEq/L Final   Comment: NO VISIBLE HEMOLYSIS                          DELTA CHECK NOTED  . Chloride 09/07/2012 100  96 - 112 mEq/L Final  . CO2 09/07/2012 28  19 - 32 mEq/L Final  . Glucose, Bld 09/07/2012 85  70 - 99 mg/dL Final  . BUN 16/12/9602 64* 6 - 23 mg/dL Final  . Creatinine, Ser 09/07/2012 1.72* 0.50 - 1.35 mg/dL Final  . Calcium 54/11/8117 8.8  8.4 - 10.5 mg/dL Final  . GFR calc non Af Amer 09/07/2012 32* >90 mL/min Final  . GFR calc Af Amer 09/07/2012 37* >90 mL/min Final   Comment:                                 The eGFR has been calculated                          using the CKD EPI equation.                          This calculation has not been                          validated in all clinical                          situations.                          eGFR's persistently                          <90 mL/min signify                          possible Chronic Kidney Disease.  . Troponin I 09/07/2012 <0.30  <0.30 ng/mL Final   Comment:                                 Due to the release kinetics of cTnI,                          a negative result within the first hours                          of the onset of symptoms does not rule out                          myocardial infarction with certainty.  If myocardial infarction is still suspected,                          repeat the test at appropriate intervals.  . Lactic Acid, Venous 09/07/2012 1.30  0.5 - 2.2 mmol/L Final  Admission on 09/04/2012, Discharged on 09/06/2012  Component Date Value Range Status  . WBC 09/04/2012 2.3* 4.0 - 10.5 K/uL Final  . RBC 09/04/2012 2.93* 4.22 - 5.81 MIL/uL Final  . Hemoglobin 09/04/2012 8.8* 13.0 - 17.0 g/dL Final  . HCT 16/12/9602 28.2* 39.0 - 52.0 % Final  . MCV 09/04/2012 96.2  78.0 - 100.0 fL Final  . MCH 09/04/2012 30.0  26.0 - 34.0  pg Final  . MCHC 09/04/2012 31.2  30.0 - 36.0 g/dL Final  . RDW 54/11/8117 19.1* 11.5 - 15.5 % Final  . Platelets 09/04/2012 PLATELET CLUMPS NOTED ON SMEAR, COUNT APPEARS DECREASED  150 - 400 K/uL Final  . Sodium 09/04/2012 138  135 - 145 mEq/L Final  . Potassium 09/04/2012 4.9  3.5 - 5.1 mEq/L Final  . Chloride 09/04/2012 105  96 - 112 mEq/L Final  . CO2 09/04/2012 27  19 - 32 mEq/L Final  . Glucose, Bld 09/04/2012 101* 70 - 99 mg/dL Final  . BUN 14/78/2956 32* 6 - 23 mg/dL Final  . Creatinine, Ser 09/04/2012 1.26  0.50 - 1.35 mg/dL Final  . Calcium 21/30/8657 9.0  8.4 - 10.5 mg/dL Final  . GFR calc non Af Amer 09/04/2012 47* >90 mL/min Final  . GFR calc Af Amer 09/04/2012 54* >90 mL/min Final   Comment:                                 The eGFR has been calculated                          using the CKD EPI equation.                          This calculation has not been                          validated in all clinical                          situations.                          eGFR's persistently                          <90 mL/min signify                          possible Chronic Kidney Disease.  . Troponin I 09/04/2012 <0.30  <0.30 ng/mL Final   Comment:                                 Due to the release kinetics of cTnI,                          a negative result within the first hours  of the onset of symptoms does not rule out                          myocardial infarction with certainty.                          If myocardial infarction is still suspected,                          repeat the test at appropriate intervals.  . Pro B Natriuretic peptide (BNP) 09/04/2012 1576.0* 0 - 450 pg/mL Final  . D-Dimer, Quant 09/04/2012 0.41  0.00 - 0.48 ug/mL-FEU Final   Comment:                                 AT THE INHOUSE ESTABLISHED CUTOFF                          VALUE OF 0.48 ug/mL FEU,                          THIS ASSAY HAS BEEN DOCUMENTED                           IN THE LITERATURE TO HAVE                          A SENSITIVITY AND NEGATIVE                          PREDICTIVE VALUE OF AT LEAST                          98 TO 99%.  THE TEST RESULT                          SHOULD BE CORRELATED WITH                          AN ASSESSMENT OF THE CLINICAL                          PROBABILITY OF DVT / VTE.  Marland Kitchen Color, Urine 09/04/2012 YELLOW  YELLOW Final  . APPearance 09/04/2012 CLEAR  CLEAR Final  . Specific Gravity, Urine 09/04/2012 1.018  1.005 - 1.030 Final  . pH 09/04/2012 5.0  5.0 - 8.0 Final  . Glucose, UA 09/04/2012 NEGATIVE  NEGATIVE mg/dL Final  . Hgb urine dipstick 09/04/2012 SMALL* NEGATIVE Final  . Bilirubin Urine 09/04/2012 NEGATIVE  NEGATIVE Final  . Ketones, ur 09/04/2012 NEGATIVE  NEGATIVE mg/dL Final  . Protein, ur 16/12/9602 NEGATIVE  NEGATIVE mg/dL Final  . Urobilinogen, UA 09/04/2012 0.2  0.0 - 1.0 mg/dL Final  . Nitrite 54/11/8117 NEGATIVE  NEGATIVE Final  . Leukocytes, UA 09/04/2012 NEGATIVE  NEGATIVE Final  . Squamous Epithelial / LPF 09/04/2012 RARE  RARE Final  . RBC / HPF 09/04/2012 3-6  <3 RBC/hpf Final  . Troponin I 09/05/2012 <0.30  <0.30 ng/mL Final   Comment:  Due to the release kinetics of cTnI,                          a negative result within the first hours                          of the onset of symptoms does not rule out                          myocardial infarction with certainty.                          If myocardial infarction is still suspected,                          repeat the test at appropriate intervals.  . Troponin I 09/05/2012 <0.30  <0.30 ng/mL Final   Comment:                                 Due to the release kinetics of cTnI,                          a negative result within the first hours                          of the onset of symptoms does not rule out                          myocardial infarction with certainty.                          If  myocardial infarction is still suspected,                          repeat the test at appropriate intervals.  . Troponin I 09/05/2012 <0.30  <0.30 ng/mL Final   Comment:                                 Due to the release kinetics of cTnI,                          a negative result within the first hours                          of the onset of symptoms does not rule out                          myocardial infarction with certainty.                          If myocardial infarction is still suspected,                          repeat the test at appropriate intervals.  Marland Kitchen TSH 09/05/2012 0.501  0.350 - 4.500 uIU/mL Final  . WBC 09/05/2012 1.7* 4.0 -  10.5 K/uL Final  . RBC 09/05/2012 3.12* 4.22 - 5.81 MIL/uL Final  . Hemoglobin 09/05/2012 9.3* 13.0 - 17.0 g/dL Final  . HCT 56/21/3086 29.9* 39.0 - 52.0 % Final  . MCV 09/05/2012 95.8  78.0 - 100.0 fL Final  . MCH 09/05/2012 29.8  26.0 - 34.0 pg Final  . MCHC 09/05/2012 31.1  30.0 - 36.0 g/dL Final  . RDW 57/84/6962 19.2* 11.5 - 15.5 % Final  . Platelets 09/05/2012 54* 150 - 400 K/uL Final   Comment: REPEATED TO VERIFY                          SPECIMEN CHECKED FOR CLOTS                          PLATELET COUNT CONFIRMED BY SMEAR  . Sodium 09/05/2012 135  135 - 145 mEq/L Final  . Potassium 09/05/2012 4.7  3.5 - 5.1 mEq/L Final  . Chloride 09/05/2012 103  96 - 112 mEq/L Final  . CO2 09/05/2012 26  19 - 32 mEq/L Final  . Glucose, Bld 09/05/2012 129* 70 - 99 mg/dL Final  . BUN 95/28/4132 33* 6 - 23 mg/dL Final  . Creatinine, Ser 09/05/2012 1.18  0.50 - 1.35 mg/dL Final  . Calcium 44/03/270 9.3  8.4 - 10.5 mg/dL Final  . Total Protein 09/05/2012 6.9  6.0 - 8.3 g/dL Final  . Albumin 53/66/4403 3.0* 3.5 - 5.2 g/dL Final  . AST 47/42/5956 17  0 - 37 U/L Final  . ALT 09/05/2012 12  0 - 53 U/L Final  . Alkaline Phosphatase 09/05/2012 72  39 - 117 U/L Final  . Total Bilirubin 09/05/2012 0.6  0.3 - 1.2 mg/dL Final  . GFR calc non Af Amer 09/05/2012  51* >90 mL/min Final  . GFR calc Af Amer 09/05/2012 59* >90 mL/min Final   Comment:                                 The eGFR has been calculated                          using the CKD EPI equation.                          This calculation has not been                          validated in all clinical                          situations.                          eGFR's persistently                          <90 mL/min signify                          possible Chronic Kidney Disease.  Marland Kitchen Prothrombin Time 09/05/2012 13.8  11.6 - 15.2 seconds Final  . INR 09/05/2012 1.07  0.00 - 1.49 Final  . MRSA by PCR 09/05/2012 NEGATIVE  NEGATIVE Final   Comment:                                 The GeneXpert MRSA Assay (FDA                          approved for NASAL specimens                          only), is one component of a                          comprehensive MRSA colonization                          surveillance program. It is not                          intended to diagnose MRSA                          infection nor to guide or                          monitor treatment for                          MRSA infections.  . Iron 09/06/2012 180* 42 - 135 ug/dL Final  . TIBC 16/12/9602 212* 215 - 435 ug/dL Final  . Saturation Ratios 09/06/2012 85* 20 - 55 % Final  . UIBC 09/06/2012 32* 125 - 400 ug/dL Final  . Vitamin V-40 98/01/9146 536  211 - 911 pg/mL Final  . RBC Folate 09/06/2012 2079* >=366 ng/mL Final   Reference range not established for pediatric patients.  . Ferritin 09/06/2012 496* 22 - 322 ng/mL Final  . Sodium 09/06/2012 137  135 - 145 mEq/L Final  . Potassium 09/06/2012 5.3* 3.5 - 5.1 mEq/L Final  . Chloride 09/06/2012 104  96 - 112 mEq/L Final  . CO2 09/06/2012 27  19 - 32 mEq/L Final  . Glucose, Bld 09/06/2012 150* 70 - 99 mg/dL Final  . BUN 82/95/6213 43* 6 - 23 mg/dL Final  . Creatinine, Ser 09/06/2012 1.23  0.50 - 1.35 mg/dL Final  . Calcium 08/65/7846 9.3  8.4 - 10.5  mg/dL Final  . GFR calc non Af Amer 09/06/2012 48* >90 mL/min Final  . GFR calc Af Amer 09/06/2012 56* >90 mL/min Final   Comment:                                 The eGFR has been calculated                          using the CKD EPI equation.                          This calculation has not been                          validated in all clinical  situations.                          eGFR's persistently                          <90 mL/min signify                          possible Chronic Kidney Disease.  . WBC 09/06/2012 2.7* 4.0 - 10.5 K/uL Final  . RBC 09/06/2012 3.18* 4.22 - 5.81 MIL/uL Final  . Hemoglobin 09/06/2012 9.5* 13.0 - 17.0 g/dL Final  . HCT 40/98/1191 30.5* 39.0 - 52.0 % Final  . MCV 09/06/2012 95.9  78.0 - 100.0 fL Final  . MCH 09/06/2012 29.9  26.0 - 34.0 pg Final  . MCHC 09/06/2012 31.1  30.0 - 36.0 g/dL Final  . RDW 47/82/9562 18.9* 11.5 - 15.5 % Final  . Platelets 09/06/2012 50* 150 - 400 K/uL Final   Comment: REPEATED TO VERIFY                          CONSISTENT WITH PREVIOUS RESULT  Appointment on 08/28/2012  Component Date Value Range Status  . WBC 08/28/2012 4.0  4.0 - 10.3 10e3/uL Final  . NEUT# 08/28/2012 2.9  1.5 - 6.5 10e3/uL Final  . HGB 08/28/2012 10.1* 13.0 - 17.1 g/dL Final  . HCT 13/10/6576 30.8* 38.4 - 49.9 % Final  . Platelets 08/28/2012 64* 140 - 400 10e3/uL Final  . MCV 08/28/2012 96.0  79.3 - 98.0 fL Final  . MCH 08/28/2012 31.5  27.2 - 33.4 pg Final  . MCHC 08/28/2012 32.8  32.0 - 36.0 g/dL Final  . RBC 46/96/2952 3.20* 4.20 - 5.82 10e6/uL Final  . RDW 08/28/2012 20.5* 11.0 - 14.6 % Final  . lymph# 08/28/2012 0.9  0.9 - 3.3 10e3/uL Final  . MONO# 08/28/2012 0.1  0.1 - 0.9 10e3/uL Final  . Eosinophils Absolute 08/28/2012 0.0  0.0 - 0.5 10e3/uL Final  . Basophils Absolute 08/28/2012 0.0  0.0 - 0.1 10e3/uL Final  . NEUT% 08/28/2012 72.5  39.0 - 75.0 % Final  . LYMPH% 08/28/2012 23.3  14.0 - 49.0 % Final  . MONO%  08/28/2012 3.0  0.0 - 14.0 % Final  . EOS% 08/28/2012 0.7  0.0 - 7.0 % Final  . BASO% 08/28/2012 0.5  0.0 - 2.0 % Final        Assessment & Plan:  Acute respiratory failure: Resolved  Chronic diastolic CHF (congestive heart failure): Chronic  Myelodysplastic syndrome, unspecified: Chronic. Requires periodic transfusions.  Combined congestive systolic and diastolic heart failure: Chronic  Pulmonary HTN: Chronic  Congestive dilated cardiomyopathy: Chronic  Postinflammatory pulmonary fibrosis: Recently diagnosed.  Medications were not changed during this visit. Patient and wife were advised his situation is quite fragile. He has multiple major organs involved with significant disease (heart, lungs, bone marrow). He is at high risk for rehospitalization related to one or more of these problems. Recent early satiety is likely related to deterioration secondary to his acute respiratory failure complicated by his combined congestive systolic and diastolic heart failure.

## 2012-09-16 DIAGNOSIS — J8410 Pulmonary fibrosis, unspecified: Secondary | ICD-10-CM | POA: Insufficient documentation

## 2012-09-18 ENCOUNTER — Ambulatory Visit (HOSPITAL_BASED_OUTPATIENT_CLINIC_OR_DEPARTMENT_OTHER): Payer: Medicare Other

## 2012-09-18 ENCOUNTER — Ambulatory Visit (HOSPITAL_BASED_OUTPATIENT_CLINIC_OR_DEPARTMENT_OTHER): Payer: Medicare Other | Admitting: Nurse Practitioner

## 2012-09-18 ENCOUNTER — Telehealth: Payer: Self-pay | Admitting: Oncology

## 2012-09-18 ENCOUNTER — Other Ambulatory Visit (HOSPITAL_BASED_OUTPATIENT_CLINIC_OR_DEPARTMENT_OTHER): Payer: Medicare Other | Admitting: Lab

## 2012-09-18 VITALS — BP 110/50 | HR 85 | Temp 96.9°F | Resp 20 | Ht 68.0 in | Wt 143.2 lb

## 2012-09-18 DIAGNOSIS — D469 Myelodysplastic syndrome, unspecified: Secondary | ICD-10-CM

## 2012-09-18 LAB — CBC WITH DIFFERENTIAL/PLATELET
BASO%: 0.6 % (ref 0.0–2.0)
EOS%: 0.7 % (ref 0.0–7.0)
HCT: 25.8 % — ABNORMAL LOW (ref 38.4–49.9)
LYMPH%: 14.8 % (ref 14.0–49.0)
MCH: 31.7 pg (ref 27.2–33.4)
MCHC: 33 g/dL (ref 32.0–36.0)
NEUT%: 79.9 % — ABNORMAL HIGH (ref 39.0–75.0)
Platelets: 71 10*3/uL — ABNORMAL LOW (ref 140–400)

## 2012-09-18 MED ORDER — DARBEPOETIN ALFA-POLYSORBATE 300 MCG/0.6ML IJ SOLN
300.0000 ug | Freq: Once | INTRAMUSCULAR | Status: AC
  Administered 2012-09-18: 300 ug via SUBCUTANEOUS
  Filled 2012-09-18: qty 0.6

## 2012-09-18 NOTE — Telephone Encounter (Signed)
, °

## 2012-09-18 NOTE — Progress Notes (Signed)
OFFICE PROGRESS NOTE  Interval history:   Dwayne Stuart is a 79 year old man with MDS. He presented with pancytopenia in March 2012. He was initially transfusion dependent for red cells. Bone marrow aspiration and biopsy 06/04/2010 showed dysplastic changes in all 3 cell lines. Chromosome studies were normal. There were no ringed sideroblasts. Aranesp injections were initiated. He has been transfusion independent since beginning Aranesp. The injections are currently being administered on a 3 week schedule. He is seen today for scheduled followup.  He was hospitalized 09/04/2012 through 09/06/2012 with congestive heart failure.  He has dyspnea on exertion. Energy level is poor. He denies chest pain. No swelling.   Objective: Blood pressure 110/50, pulse 85, temperature 96.9 F (36.1 C), temperature source Oral, resp. rate 20, height 5\' 8"  (1.727 m), weight 143 lb 3.2 oz (64.955 kg).  No thrush. Inspiratory rales at both lung bases right greater than left. Regular cardiac rhythm. Abdomen is soft and nontender. No organomegaly. Very minimal lower leg edema bilaterally. Heart of hearing.  Lab Results: Lab Results  Component Value Date   WBC 4.3 09/18/2012   HGB 8.5* 09/18/2012   HCT 25.8* 09/18/2012   MCV 96.1 09/18/2012   PLT 71* 09/18/2012    Chemistry:    Chemistry      Component Value Date/Time   NA 134* 09/07/2012 1220   K 4.3 09/07/2012 1220   CL 100 09/07/2012 1220   CO2 28 09/07/2012 1220   BUN 64* 09/07/2012 1220   CREATININE 1.72* 09/07/2012 1220      Component Value Date/Time   CALCIUM 8.8 09/07/2012 1220   ALKPHOS 72 09/05/2012 0200   AST 17 09/05/2012 0200   ALT 12 09/05/2012 0200   BILITOT 0.6 09/05/2012 0200       Studies/Results: Dg Chest 2 View  09/07/2012   *RADIOLOGY REPORT*  Clinical Data: Chest pain  CHEST - 2 VIEW  Comparison: CT chest dated 09/05/2012  Findings: Chronic interstitial lung disease, better evaluated on recent CT.  No superimposed opacities suspicious for  pneumonia.  No pleural effusion or pneumothorax.  Cardiomegaly.  Degenerative changes of the visualized thoracolumbar spine.  Deformity of the distal/lateral left clavicle.  IMPRESSION: Chronic interstitial lung disease, better evaluated on recent CT.  No superimposed opacities suspicious for pneumonia.   Original Report Authenticated By: Charline Bills, M.D.   Dg Chest 2 View  09/04/2012   *RADIOLOGY REPORT*  Clinical Data: Shortness of breath, weakness.  CHEST - 2 VIEW  Comparison: 06/02/2010  Findings: Cardiomegaly.  Diffuse chronic changes throughout the lungs compatible chronic interstitial lung disease.  No change since prior study.  No definite acute process.  No visible effusions or acute bony abnormality.  Degenerative changes in the shoulders.  IMPRESSION: Stable severe chronic interstitial lung disease.  Mild cardiomegaly.  No definite acute process.   Original Report Authenticated By: Charlett Nose, M.D.   Ct Chest Wo Contrast  09/05/2012   *RADIOLOGY REPORT*  Clinical Data: Chronic lung disease, weakness, cough, shortness of breath  CT CHEST WITHOUT CONTRAST  Technique:  Multidetector CT imaging of the chest was performed following the standard protocol without IV contrast.  Comparison: Chest radiographs dated 08/25/2012  Findings: Evaluation is constrained by respiratory motion.  Subpleural reticulation/fibrosis with possible honeycombing in the right lower lung, compatible with moderate chronic interstitial lung disease, possibly reflecting a UIP pattern.  No superimposed opacities suspicious for pneumonia. No suspicious pulmonary nodules. No pleural effusion or pneumothorax.  Possible tracheobronchomegaly.  Cardiomegaly.  No pericardial effusion.  Coronary atherosclerosis. Atherosclerotic calcifications of the aortic arch.  Small mediastinal lymph nodes which do not meet pathologic CT size criteria, likely reactive.  No suspicious axillary lymphadenopathy.  Visualized upper abdomen is notable  for vascular calcifications and cholecystectomy clips.  Degenerative changes of the visualized thoracolumbar spine.  IMPRESSION: Moderate chronic interstitial lung disease, as described above, possibly reflecting a UIP pattern.  No superimposed opacities suspicious for pneumonia.  Possible tracheobronchomegaly.   Original Report Authenticated By: Charline Bills, M.D.    Medications: I have reviewed the patient's current medications.  Assessment/Plan:  1. Myelodysplastic syndrome maintained on every three-week Aranesp injections. 2. Hypothyroid on replacement. 3. Pernicious anemia. He receives monthly B12 injections. 4. BPH. 5. Degenerative arthritis. 6. Recent hospitalization with heart failure.  Disposition-plan to continue every three-week Aranesp injections. He will return for a followup visit in 4 months. He would like to receive the Aranesp injections at the nursing facility if possible. We will look into this for him.  Plan reviewed with Dr. Cyndie Chime.  Lonna Cobb ANP/GNP-BC    CC Dr. Murray Hodgkins and Dr. Rollene Rotunda.

## 2012-09-28 ENCOUNTER — Non-Acute Institutional Stay: Payer: Medicare Other | Admitting: Internal Medicine

## 2012-09-28 ENCOUNTER — Encounter: Payer: Self-pay | Admitting: Internal Medicine

## 2012-09-28 VITALS — BP 100/56 | HR 96 | Ht 68.0 in | Wt 138.0 lb

## 2012-09-28 DIAGNOSIS — I5040 Unspecified combined systolic (congestive) and diastolic (congestive) heart failure: Secondary | ICD-10-CM

## 2012-09-28 DIAGNOSIS — J8410 Pulmonary fibrosis, unspecified: Secondary | ICD-10-CM

## 2012-09-28 DIAGNOSIS — Z79899 Other long term (current) drug therapy: Secondary | ICD-10-CM

## 2012-09-28 DIAGNOSIS — M47816 Spondylosis without myelopathy or radiculopathy, lumbar region: Secondary | ICD-10-CM

## 2012-09-28 DIAGNOSIS — D469 Myelodysplastic syndrome, unspecified: Secondary | ICD-10-CM

## 2012-09-28 DIAGNOSIS — R5381 Other malaise: Secondary | ICD-10-CM

## 2012-09-28 DIAGNOSIS — E039 Hypothyroidism, unspecified: Secondary | ICD-10-CM

## 2012-09-28 DIAGNOSIS — M47817 Spondylosis without myelopathy or radiculopathy, lumbosacral region: Secondary | ICD-10-CM

## 2012-09-28 NOTE — Progress Notes (Signed)
Subjective:    Patient ID: Dwayne Stuart, male    DOB: September 26, 1914, 79 y.o.   MRN: [id number]  HPI  Myelodysplastic syndrome, unspecified : chronic. Requires periodic transfusions.  Hypothyroidism: stable  Combined congestive systolic and diastolic heart failure: chronic dyspnea. Better than when hospitalized. No change since lst visit.  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 Having difficulty with bathing and transfers in the bathroom. Wife assists him.  Degenerative arthritis of lumbar spine: contributes to chronic low back pain  Chronic diastolic CHF (congestive heart failure): controlled  Current Outpatient Prescriptions on File Prior to Visit  Medication Sig Dispense Refill  . Cholecalciferol (VITAMIN D) 1000 UNITS capsule Take 1,000 Units by mouth daily.        Marland Kitchen docusate sodium (COLACE) 100 MG capsule Take 100 mg by mouth. Take one daily      . doxazosin (CARDURA) 2 MG tablet Take 2 mg by mouth daily.       . finasteride (PROSCAR) 5 MG tablet Take 5 mg by mouth daily.      . folic acid (FOLVITE) 1 MG tablet Take 1 mg by mouth daily.      Marland Kitchen levothyroxine (SYNTHROID, LEVOTHROID) 100 MCG tablet Take 100 mcg by mouth daily before breakfast.      . naproxen sodium (ANAPROX) 220 MG tablet Take 440 mg by mouth 2 (two) times daily as needed (for pain).       . predniSONE (DELTASONE) 20 MG tablet       . PROAIR HFA 108 (90 BASE) MCG/ACT inhaler Inhale 2 puffs into the lungs every 4 (four) hours as needed for wheezing or shortness of breath.             Review of Systems DATA OBTAINED: from patient, medical record, wife  GENERAL: Feels weak. Has lost weight. Chronically fatigued. Has early satiety. SKIN: No itch, rash or open wounds. Pale EYES: No eye pain, dryness or itching  No change in vision. Corrective lenses. EARS: No earache, tinnitus. Partial hearing loss NOSE: No congestion, drainage or  bleeding MOUTH/THROAT: No mouth or tooth pain  No sore throat No difficulty chewing or swallowing RESPIRATORY: No cough, wheezing. Short of breath with minimal exertion CARDIAC: No chest pain, palpitations  1+ edema bilaterally. GI: No abdominal pain  No N/V/D or constipation  No heartburn or reflux   GU: No dysuria, frequency or urgency  No change in urine volume or character No nocturia or change in stream   MUSCULOSKELETAL: No joint pain, swelling or stiffness  patient has chronic back discomfort. Knees are weak.    NEUROLOGIC: No dizziness, fainting, headache,  numbness  No change in mental status.   PSYCHIATRIC: No feelings of anxiety, depression Sleeps well.  No behavior issue       Objective:BP 100/56  Pulse 96  Ht 5\' 8"  (1.727 m)  Wt 138 lb (62.596 kg)  BMI 20.99 kg/m2    Physical Exam GENERAL APPEARANCE: No acute distress, appropriately groomed, normal body habitus. Alert, pleasant, conversant. SKIN: pale HEAD: Normocephalic, atraumatic EYES: Conjunctiva/lids clear. Pupils round, reactive. EOMs intact. Wears corrective lenses. EARS: External exam WNL, canals clear, TM WNL. Hearing mildly diminished. NOSE: No deformity or discharge. MOUTH/THROAT: Lips w/o lesions. Oral mucosa, tongue moist, w/o lesion. Oropharynx w/o redness or lesions.   NECK: Supple, full ROM. No thyroid tenderness, enlargement or nodule LYMPHATICS: No head, neck or supraclavicular adenopathy RESPIRATORY: Breathing is even, unlabored. Bilateral dry rales present.  CARDIOVASCULAR: Heart RRR. No murmur or extra heart sounds. 1+ pedal edema. GASTROINTESTINAL: Abdomen is soft, non-tender, not distended w/ normal bowel sounds. No hepatic or splenic enlargement. No mass, ventral or inguinal hernia. MUSCULOSKELETAL: Moves all extremities with full ROM, strength and tone. Moderate kyphosis. Poor balance.   NEUROLOGIC: Oriented to time, place, person. Cranial nerves 2-12 grossly intact, speech clear, no tremor.    PSYCHIATRIC: Mood and affect appropriate to situation   Appointment on 09/18/2012  Component Date Value Range Status  . WBC 09/18/2012 4.3  4.0 - 10.3 10e3/uL Final  . NEUT# 09/18/2012 3.4  1.5 - 6.5 10e3/uL Final  . HGB 09/18/2012 8.5* 13.0 - 17.1 g/dL Final  . HCT 40/98/1191 25.8* 38.4 - 49.9 % Final  . Platelets 09/18/2012 71* 140 - 400 10e3/uL Final  . MCV 09/18/2012 96.1  79.3 - 98.0 fL Final  . MCH 09/18/2012 31.7  27.2 - 33.4 pg Final  . MCHC 09/18/2012 33.0  32.0 - 36.0 g/dL Final  . RBC 47/82/9562 2.68* 4.20 - 5.82 10e6/uL Final  . RDW 09/18/2012 20.9* 11.0 - 14.6 % Final  . lymph# 09/18/2012 0.6* 0.9 - 3.3 10e3/uL Final  . MONO# 09/18/2012 0.2  0.1 - 0.9 10e3/uL Final  . Eosinophils Absolute 09/18/2012 0.0  0.0 - 0.5 10e3/uL Final  . Basophils Absolute 09/18/2012 0.0  0.0 - 0.1 10e3/uL Final  . NEUT% 09/18/2012 79.9* 39.0 - 75.0 % Final  . LYMPH% 09/18/2012 14.8  14.0 - 49.0 % Final  . MONO% 09/18/2012 4.0  0.0 - 14.0 % Final  . EOS% 09/18/2012 0.7  0.0 - 7.0 % Final  . BASO% 09/18/2012 0.6  0.0 - 2.0 % Final  Admission on 09/07/2012, Discharged on 09/07/2012  Component Date Value Range Status  . Sodium 09/07/2012 134* 135 - 145 mEq/L Final  . Potassium 09/07/2012 4.3  3.5 - 5.1 mEq/L Final   Comment: NO VISIBLE HEMOLYSIS                          DELTA CHECK NOTED  . Chloride 09/07/2012 100  96 - 112 mEq/L Final  . CO2 09/07/2012 28  19 - 32 mEq/L Final  . Glucose, Bld 09/07/2012 85  70 - 99 mg/dL Final  . BUN 13/10/6576 64* 6 - 23 mg/dL Final  . Creatinine, Ser 09/07/2012 1.72* 0.50 - 1.35 mg/dL Final  . Calcium 46/96/2952 8.8  8.4 - 10.5 mg/dL Final  . GFR calc non Af Amer 09/07/2012 32* >90 mL/min Final  . GFR calc Af Amer 09/07/2012 37* >90 mL/min Final   Comment:                                 The eGFR has been calculated                          using the CKD EPI equation.                          This calculation has not been                           validated in all clinical  situations.                          eGFR's persistently                          <90 mL/min signify                          possible Chronic Kidney Disease.  . Troponin I 09/07/2012 <0.30  <0.30 ng/mL Final   Comment:                                 Due to the release kinetics of cTnI,                          a negative result within the first hours                          of the onset of symptoms does not rule out                          myocardial infarction with certainty.                          If myocardial infarction is still suspected,                          repeat the test at appropriate intervals.  . Lactic Acid, Venous 09/07/2012 1.30  0.5 - 2.2 mmol/L Final  Admission on 09/04/2012, Discharged on 09/06/2012  Component Date Value Range Status  . WBC 09/04/2012 2.3* 4.0 - 10.5 K/uL Final  . RBC 09/04/2012 2.93* 4.22 - 5.81 MIL/uL Final  . Hemoglobin 09/04/2012 8.8* 13.0 - 17.0 g/dL Final  . HCT 76/19/5093 28.2* 39.0 - 52.0 % Final  . MCV 09/04/2012 96.2  78.0 - 100.0 fL Final  . MCH 09/04/2012 30.0  26.0 - 34.0 pg Final  . MCHC 09/04/2012 31.2  30.0 - 36.0 g/dL Final  . RDW 26/71/2458 19.1* 11.5 - 15.5 % Final  . Platelets 09/04/2012 PLATELET CLUMPS NOTED ON SMEAR, COUNT APPEARS DECREASED  150 - 400 K/uL Final  . Sodium 09/04/2012 138  135 - 145 mEq/L Final  . Potassium 09/04/2012 4.9  3.5 - 5.1 mEq/L Final  . Chloride 09/04/2012 105  96 - 112 mEq/L Final  . CO2 09/04/2012 27  19 - 32 mEq/L Final  . Glucose, Bld 09/04/2012 101* 70 - 99 mg/dL Final  . BUN 09/98/3382 32* 6 - 23 mg/dL Final  . Creatinine, Ser 09/04/2012 1.26  0.50 - 1.35 mg/dL Final  . Calcium 50/53/9767 9.0  8.4 - 10.5 mg/dL Final  . GFR calc non Af Amer 09/04/2012 47* >90 mL/min Final  . GFR calc Af Amer 09/04/2012 54* >90 mL/min Final   Comment:                                 The eGFR has been calculated                          using the CKD  EPI equation.                          This calculation has not been                          validated in all clinical                          situations.                          eGFR's persistently                          <90 mL/min signify                          possible Chronic Kidney Disease.  . Troponin I 09/04/2012 <0.30  <0.30 ng/mL Final   Comment:                                 Due to the release kinetics of cTnI,                          a negative result within the first hours                          of the onset of symptoms does not rule out                          myocardial infarction with certainty.                          If myocardial infarction is still suspected,                          repeat the test at appropriate intervals.  . Pro B Natriuretic peptide (BNP) 09/04/2012 1576.0* 0 - 450 pg/mL Final  . D-Dimer, Quant 09/04/2012 0.41  0.00 - 0.48 ug/mL-FEU Final   Comment:                                 AT THE INHOUSE ESTABLISHED CUTOFF                          VALUE OF 0.48 ug/mL FEU,                          THIS ASSAY HAS BEEN DOCUMENTED                          IN THE LITERATURE TO HAVE                          A SENSITIVITY AND NEGATIVE                          PREDICTIVE VALUE OF AT LEAST  98 TO 99%.  THE TEST RESULT                          SHOULD BE CORRELATED WITH                          AN ASSESSMENT OF THE CLINICAL                          PROBABILITY OF DVT / VTE.  Marland Kitchen Color, Urine 09/04/2012 YELLOW  YELLOW Final  . APPearance 09/04/2012 CLEAR  CLEAR Final  . Specific Gravity, Urine 09/04/2012 1.018  1.005 - 1.030 Final  . pH 09/04/2012 5.0  5.0 - 8.0 Final  . Glucose, UA 09/04/2012 NEGATIVE  NEGATIVE mg/dL Final  . Hgb urine dipstick 09/04/2012 SMALL* NEGATIVE Final  . Bilirubin Urine 09/04/2012 NEGATIVE  NEGATIVE Final  . Ketones, ur 09/04/2012 NEGATIVE  NEGATIVE mg/dL Final  . Protein, ur 16/12/9602 NEGATIVE   NEGATIVE mg/dL Final  . Urobilinogen, UA 09/04/2012 0.2  0.0 - 1.0 mg/dL Final  . Nitrite 54/11/8117 NEGATIVE  NEGATIVE Final  . Leukocytes, UA 09/04/2012 NEGATIVE  NEGATIVE Final  . Squamous Epithelial / LPF 09/04/2012 RARE  RARE Final  . RBC / HPF 09/04/2012 3-6  <3 RBC/hpf Final  . Troponin I 09/05/2012 <0.30  <0.30 ng/mL Final   Comment:                                 Due to the release kinetics of cTnI,                          a negative result within the first hours                          of the onset of symptoms does not rule out                          myocardial infarction with certainty.                          If myocardial infarction is still suspected,                          repeat the test at appropriate intervals.  . Troponin I 09/05/2012 <0.30  <0.30 ng/mL Final   Comment:                                 Due to the release kinetics of cTnI,                          a negative result within the first hours                          of the onset of symptoms does not rule out                          myocardial infarction with certainty.  If myocardial infarction is still suspected,                          repeat the test at appropriate intervals.  . Troponin I 09/05/2012 <0.30  <0.30 ng/mL Final   Comment:                                 Due to the release kinetics of cTnI,                          a negative result within the first hours                          of the onset of symptoms does not rule out                          myocardial infarction with certainty.                          If myocardial infarction is still suspected,                          repeat the test at appropriate intervals.  Marland Kitchen TSH 09/05/2012 0.501  0.350 - 4.500 uIU/mL Final  . WBC 09/05/2012 1.7* 4.0 - 10.5 K/uL Final  . RBC 09/05/2012 3.12* 4.22 - 5.81 MIL/uL Final  . Hemoglobin 09/05/2012 9.3* 13.0 - 17.0 g/dL Final  . HCT 96/29/5284 29.9* 39.0 - 52.0 %  Final  . MCV 09/05/2012 95.8  78.0 - 100.0 fL Final  . MCH 09/05/2012 29.8  26.0 - 34.0 pg Final  . MCHC 09/05/2012 31.1  30.0 - 36.0 g/dL Final  . RDW 13/24/4010 19.2* 11.5 - 15.5 % Final  . Platelets 09/05/2012 54* 150 - 400 K/uL Final   Comment: REPEATED TO VERIFY                          SPECIMEN CHECKED FOR CLOTS                          PLATELET COUNT CONFIRMED BY SMEAR  . Sodium 09/05/2012 135  135 - 145 mEq/L Final  . Potassium 09/05/2012 4.7  3.5 - 5.1 mEq/L Final  . Chloride 09/05/2012 103  96 - 112 mEq/L Final  . CO2 09/05/2012 26  19 - 32 mEq/L Final  . Glucose, Bld 09/05/2012 129* 70 - 99 mg/dL Final  . BUN 27/25/3664 33* 6 - 23 mg/dL Final  . Creatinine, Ser 09/05/2012 1.18  0.50 - 1.35 mg/dL Final  . Calcium 40/34/7425 9.3  8.4 - 10.5 mg/dL Final  . Total Protein 09/05/2012 6.9  6.0 - 8.3 g/dL Final  . Albumin 95/63/8756 3.0* 3.5 - 5.2 g/dL Final  . AST 43/32/9518 17  0 - 37 U/L Final  . ALT 09/05/2012 12  0 - 53 U/L Final  . Alkaline Phosphatase 09/05/2012 72  39 - 117 U/L Final  . Total Bilirubin 09/05/2012 0.6  0.3 - 1.2 mg/dL Final  . GFR calc non Af Amer 09/05/2012 51* >90 mL/min Final  . GFR calc Af Amer 09/05/2012 59* >90 mL/min Final   Comment:  The eGFR has been calculated                          using the CKD EPI equation.                          This calculation has not been                          validated in all clinical                          situations.                          eGFR's persistently                          <90 mL/min signify                          possible Chronic Kidney Disease.  Marland Kitchen Prothrombin Time 09/05/2012 13.8  11.6 - 15.2 seconds Final  . INR 09/05/2012 1.07  0.00 - 1.49 Final  . MRSA by PCR 09/05/2012 NEGATIVE  NEGATIVE Final   Comment:                                 The GeneXpert MRSA Assay (FDA                          approved for NASAL specimens                          only), is  one component of a                          comprehensive MRSA colonization                          surveillance program. It is not                          intended to diagnose MRSA                          infection nor to guide or                          monitor treatment for                          MRSA infections.  . Iron 09/06/2012 180* 42 - 135 ug/dL Final  . TIBC 16/12/9602 212* 215 - 435 ug/dL Final  . Saturation Ratios 09/06/2012 85* 20 - 55 % Final  . UIBC 09/06/2012 32* 125 - 400 ug/dL Final  . Vitamin V-40 98/01/9146 536  211 - 911 pg/mL Final  . RBC Folate 09/06/2012 2079* >=366 ng/mL Final   Reference range not established for pediatric patients.  . Ferritin 09/06/2012 496* 22 - 322 ng/mL Final  . Sodium 09/06/2012 137  135 - 145 mEq/L  Final  . Potassium 09/06/2012 5.3* 3.5 - 5.1 mEq/L Final  . Chloride 09/06/2012 104  96 - 112 mEq/L Final  . CO2 09/06/2012 27  19 - 32 mEq/L Final  . Glucose, Bld 09/06/2012 150* 70 - 99 mg/dL Final  . BUN 16/12/9602 43* 6 - 23 mg/dL Final  . Creatinine, Ser 09/06/2012 1.23  0.50 - 1.35 mg/dL Final  . Calcium 54/11/8117 9.3  8.4 - 10.5 mg/dL Final  . GFR calc non Af Amer 09/06/2012 48* >90 mL/min Final  . GFR calc Af Amer 09/06/2012 56* >90 mL/min Final   Comment:                                 The eGFR has been calculated                          using the CKD EPI equation.                          This calculation has not been                          validated in all clinical                          situations.                          eGFR's persistently                          <90 mL/min signify                          possible Chronic Kidney Disease.  . WBC 09/06/2012 2.7* 4.0 - 10.5 K/uL Final  . RBC 09/06/2012 3.18* 4.22 - 5.81 MIL/uL Final  . Hemoglobin 09/06/2012 9.5* 13.0 - 17.0 g/dL Final  . HCT 14/78/2956 30.5* 39.0 - 52.0 % Final  . MCV 09/06/2012 95.9  78.0 - 100.0 fL Final  . MCH 09/06/2012 29.9  26.0 - 34.0  pg Final  . MCHC 09/06/2012 31.1  30.0 - 36.0 g/dL Final  . RDW 21/30/8657 18.9* 11.5 - 15.5 % Final  . Platelets 09/06/2012 50* 150 - 400 K/uL Final   Comment: REPEATED TO VERIFY                          CONSISTENT WITH PREVIOUS RESULT  Appointment on 08/28/2012  Component Date Value Range Status  . WBC 08/28/2012 4.0  4.0 - 10.3 10e3/uL Final  . NEUT# 08/28/2012 2.9  1.5 - 6.5 10e3/uL Final  . HGB 08/28/2012 10.1* 13.0 - 17.1 g/dL Final  . HCT 84/69/6295 30.8* 38.4 - 49.9 % Final  . Platelets 08/28/2012 64* 140 - 400 10e3/uL Final  . MCV 08/28/2012 96.0  79.3 - 98.0 fL Final  . MCH 08/28/2012 31.5  27.2 - 33.4 pg Final  . MCHC 08/28/2012 32.8  32.0 - 36.0 g/dL Final  . RBC 28/41/3244 3.20* 4.20 - 5.82 10e6/uL Final  . RDW 08/28/2012 20.5* 11.0 - 14.6 % Final  . lymph# 08/28/2012 0.9  0.9 - 3.3 10e3/uL Final  . MONO# 08/28/2012 0.1  0.1 - 0.9 10e3/uL Final  . Eosinophils Absolute 08/28/2012 0.0  0.0 - 0.5 10e3/uL Final  . Basophils Absolute 08/28/2012 0.0  0.0 - 0.1 10e3/uL Final  . NEUT% 08/28/2012 72.5  39.0 - 75.0 % Final  . LYMPH% 08/28/2012 23.3  14.0 - 49.0 % Final  . MONO% 08/28/2012 3.0  0.0 - 14.0 % Final  . EOS% 08/28/2012 0.7  0.0 - 7.0 % Final  . BASO% 08/28/2012 0.5  0.0 - 2.0 % Final  Appointment on 08/07/2012  Component Date Value Range Status  . WBC 08/07/2012 2.6* 4.0 - 10.3 10e3/uL Final  . NEUT# 08/07/2012 1.5  1.5 - 6.5 10e3/uL Final  . HGB 08/07/2012 9.7* 13.0 - 17.1 g/dL Final  . HCT 40/98/1191 30.5* 38.4 - 49.9 % Final  . Platelets 08/07/2012 49* 140 - 400 10e3/uL Final  . MCV 08/07/2012 96.2  79.3 - 98.0 fL Final  . MCH 08/07/2012 30.6  27.2 - 33.4 pg Final  . MCHC 08/07/2012 31.8* 32.0 - 36.0 g/dL Final  . RBC 47/82/9562 3.17* 4.20 - 5.82 10e6/uL Final  . RDW 08/07/2012 19.1* 11.0 - 14.6 % Final  . lymph# 08/07/2012 1.1  0.9 - 3.3 10e3/uL Final  . MONO# 08/07/2012 0.1  0.1 - 0.9 10e3/uL Final  . Eosinophils Absolute 08/07/2012 0.0  0.0 - 0.5 10e3/uL  Final  . Basophils Absolute 08/07/2012 0.0  0.0 - 0.1 10e3/uL Final  . NEUT% 08/07/2012 55.3  39.0 - 75.0 % Final  . LYMPH% 08/07/2012 40.2  14.0 - 49.0 % Final  . MONO% 08/07/2012 3.0  0.0 - 14.0 % Final  . EOS% 08/07/2012 1.5  0.0 - 7.0 % Final  . BASO% 08/07/2012 0.0  0.0 - 2.0 % Final  . nRBC 08/07/2012 0  0 - 0 % Final   09/19/12 BMP: normal except BUN 40. Creat 1.22  TSH: 2.400      Assessment & Plan:  Myelodysplastic syndrome, unspecified - Plan: CBC with Differential  Hypothyroidism: compensated  Combined congestive systolic and diastolic heart failure - Plan: Comprehensive metabolic panel  Postinflammatory pulmonary fibrosis: continue to monitor O2 sat.  Debility: discussed PT, but he is not interested  Degenerative arthritis of lumbar spine: chronic. Unchanged

## 2012-09-28 NOTE — Patient Instructions (Addendum)
Continue current medication.

## 2012-10-09 ENCOUNTER — Other Ambulatory Visit (HOSPITAL_BASED_OUTPATIENT_CLINIC_OR_DEPARTMENT_OTHER): Payer: Medicare Other | Admitting: Lab

## 2012-10-09 ENCOUNTER — Other Ambulatory Visit: Payer: Self-pay | Admitting: Oncology

## 2012-10-09 ENCOUNTER — Ambulatory Visit (HOSPITAL_BASED_OUTPATIENT_CLINIC_OR_DEPARTMENT_OTHER): Payer: Medicare Other

## 2012-10-09 VITALS — BP 107/42 | HR 83 | Temp 97.7°F

## 2012-10-09 DIAGNOSIS — D469 Myelodysplastic syndrome, unspecified: Secondary | ICD-10-CM

## 2012-10-09 LAB — CBC WITH DIFFERENTIAL/PLATELET
BASO%: 0.6 % (ref 0.0–2.0)
EOS%: 1 % (ref 0.0–7.0)
MCH: 31.5 pg (ref 27.2–33.4)
MCHC: 32.5 g/dL (ref 32.0–36.0)
NEUT%: 50.4 % (ref 39.0–75.0)
RDW: 20.5 % — ABNORMAL HIGH (ref 11.0–14.6)
lymph#: 1 10*3/uL (ref 0.9–3.3)

## 2012-10-09 LAB — MORPHOLOGY

## 2012-10-09 MED ORDER — DARBEPOETIN ALFA-POLYSORBATE 300 MCG/0.6ML IJ SOLN
300.0000 ug | Freq: Once | INTRAMUSCULAR | Status: AC
  Administered 2012-10-09: 300 ug via SUBCUTANEOUS
  Filled 2012-10-09: qty 0.6

## 2012-10-11 ENCOUNTER — Telehealth: Payer: Self-pay | Admitting: Oncology

## 2012-10-11 ENCOUNTER — Other Ambulatory Visit: Payer: Self-pay | Admitting: *Deleted

## 2012-10-11 DIAGNOSIS — D46Z Other myelodysplastic syndromes: Secondary | ICD-10-CM

## 2012-10-11 DIAGNOSIS — D4620 Refractory anemia with excess of blasts, unspecified: Secondary | ICD-10-CM

## 2012-10-11 MED ORDER — FOLIC ACID 1 MG PO TABS: 1.0000 mg | ORAL_TABLET | Freq: Every day | ORAL | Status: AC

## 2012-10-11 NOTE — Telephone Encounter (Signed)
Per 7/21 pof inj q3w. Pt already on schedule w3q for lb/inj until next f/u. No other orders.

## 2012-10-18 ENCOUNTER — Encounter: Payer: Self-pay | Admitting: Internal Medicine

## 2012-10-30 ENCOUNTER — Other Ambulatory Visit (HOSPITAL_BASED_OUTPATIENT_CLINIC_OR_DEPARTMENT_OTHER): Payer: Medicare Other | Admitting: Lab

## 2012-10-30 ENCOUNTER — Ambulatory Visit (HOSPITAL_BASED_OUTPATIENT_CLINIC_OR_DEPARTMENT_OTHER): Payer: Medicare Other

## 2012-10-30 VITALS — BP 102/42 | HR 87 | Temp 97.2°F

## 2012-10-30 DIAGNOSIS — D469 Myelodysplastic syndrome, unspecified: Secondary | ICD-10-CM

## 2012-10-30 LAB — CBC WITH DIFFERENTIAL/PLATELET
BASO%: 0 % (ref 0.0–2.0)
Basophils Absolute: 0 10*3/uL (ref 0.0–0.1)
Eosinophils Absolute: 0 10*3/uL (ref 0.0–0.5)
HCT: 30.8 % — ABNORMAL LOW (ref 38.4–49.9)
HGB: 9.5 g/dL — ABNORMAL LOW (ref 13.0–17.1)
LYMPH%: 35.5 % (ref 14.0–49.0)
MCHC: 30.8 g/dL — ABNORMAL LOW (ref 32.0–36.0)
MONO#: 0.1 10*3/uL (ref 0.1–0.9)
NEUT%: 59 % (ref 39.0–75.0)
Platelets: 49 10*3/uL — ABNORMAL LOW (ref 140–400)
WBC: 2.6 10*3/uL — ABNORMAL LOW (ref 4.0–10.3)

## 2012-10-30 LAB — MORPHOLOGY: PLT EST: DECREASED

## 2012-10-30 MED ORDER — DARBEPOETIN ALFA-POLYSORBATE 300 MCG/0.6ML IJ SOLN
300.0000 ug | Freq: Once | INTRAMUSCULAR | Status: AC
  Administered 2012-10-30: 300 ug via SUBCUTANEOUS
  Filled 2012-10-30: qty 0.6

## 2012-11-01 ENCOUNTER — Telehealth: Payer: Self-pay | Admitting: *Deleted

## 2012-11-01 NOTE — Telephone Encounter (Signed)
Spoke with patient.  Let his know that blood counts are in the same range as before.  He appreciated the call.

## 2012-11-01 NOTE — Telephone Encounter (Signed)
Message copied by Orbie Hurst on Wed Nov 01, 2012  2:32 PM ------      Message from: Dwayne Stuart      Created: Wed Nov 01, 2012  9:19 AM       Call pt: blood counts in same range as previous values ------

## 2012-11-03 ENCOUNTER — Other Ambulatory Visit: Payer: Self-pay | Admitting: Internal Medicine

## 2012-11-06 ENCOUNTER — Observation Stay (HOSPITAL_COMMUNITY)
Admission: EM | Admit: 2012-11-06 | Discharge: 2012-11-07 | Disposition: A | Discharge status: Home / Self Care | Payer: Medicare Other | Attending: Internal Medicine | Admitting: Internal Medicine

## 2012-11-06 ENCOUNTER — Encounter (HOSPITAL_COMMUNITY): Payer: Self-pay | Admitting: Emergency Medicine

## 2012-11-06 ENCOUNTER — Emergency Department (HOSPITAL_COMMUNITY): Payer: Medicare Other

## 2012-11-06 DIAGNOSIS — N400 Enlarged prostate without lower urinary tract symptoms: Secondary | ICD-10-CM | POA: Insufficient documentation

## 2012-11-06 DIAGNOSIS — E039 Hypothyroidism, unspecified: Secondary | ICD-10-CM | POA: Diagnosis present

## 2012-11-06 DIAGNOSIS — I2789 Other specified pulmonary heart diseases: Secondary | ICD-10-CM | POA: Insufficient documentation

## 2012-11-06 DIAGNOSIS — I5040 Unspecified combined systolic (congestive) and diastolic (congestive) heart failure: Secondary | ICD-10-CM | POA: Diagnosis present

## 2012-11-06 DIAGNOSIS — J189 Pneumonia, unspecified organism: Secondary | ICD-10-CM

## 2012-11-06 DIAGNOSIS — D469 Myelodysplastic syndrome, unspecified: Secondary | ICD-10-CM | POA: Insufficient documentation

## 2012-11-06 DIAGNOSIS — D61818 Other pancytopenia: Secondary | ICD-10-CM | POA: Insufficient documentation

## 2012-11-06 DIAGNOSIS — R0902 Hypoxemia: Secondary | ICD-10-CM | POA: Diagnosis present

## 2012-11-06 DIAGNOSIS — E43 Unspecified severe protein-calorie malnutrition: Secondary | ICD-10-CM | POA: Insufficient documentation

## 2012-11-06 DIAGNOSIS — I509 Heart failure, unspecified: Secondary | ICD-10-CM | POA: Insufficient documentation

## 2012-11-06 DIAGNOSIS — R0602 Shortness of breath: Secondary | ICD-10-CM

## 2012-11-06 DIAGNOSIS — D649 Anemia, unspecified: Principal | ICD-10-CM | POA: Insufficient documentation

## 2012-11-06 DIAGNOSIS — R0682 Tachypnea, not elsewhere classified: Secondary | ICD-10-CM

## 2012-11-06 DIAGNOSIS — I5042 Chronic combined systolic (congestive) and diastolic (congestive) heart failure: Secondary | ICD-10-CM | POA: Insufficient documentation

## 2012-11-06 LAB — CBC
Hemoglobin: 8.8 g/dL — ABNORMAL LOW (ref 13.0–17.0)
MCH: 31.3 pg (ref 26.0–34.0)
MCV: 102.2 fL — ABNORMAL HIGH (ref 78.0–100.0)
Platelets: 58 10*3/uL — ABNORMAL LOW (ref 150–400)
Platelets: 55 10*3/uL — ABNORMAL LOW (ref 150–400)
RBC: 2.81 MIL/uL — ABNORMAL LOW (ref 4.22–5.81)
RBC: 2.72 MIL/uL — ABNORMAL LOW (ref 4.22–5.81)
WBC: 2.4 10*3/uL — ABNORMAL LOW (ref 4.0–10.5)

## 2012-11-06 LAB — URINALYSIS, ROUTINE W REFLEX MICROSCOPIC
Glucose, UA: NEGATIVE mg/dL
Leukocytes, UA: NEGATIVE
Nitrite: NEGATIVE
pH: 5.5 (ref 5.0–8.0)

## 2012-11-06 LAB — BLOOD GAS, ARTERIAL
Acid-Base Excess: 2.3 mmol/L — ABNORMAL HIGH (ref 0.0–2.0)
Drawn by: 295031
O2 Content: 3 L/min
pCO2 arterial: 42 mmHg (ref 35.0–45.0)
pO2, Arterial: 90.3 mmHg (ref 80.0–100.0)

## 2012-11-06 LAB — BASIC METABOLIC PANEL
CO2: 29 mEq/L (ref 19–32)
Chloride: 106 mEq/L (ref 96–112)
GFR calc Af Amer: 60 mL/min — ABNORMAL LOW (ref >90)
Potassium: 4.3 mEq/L (ref 3.5–5.1)
Sodium: 139 mEq/L (ref 135–145)

## 2012-11-06 LAB — LACTIC ACID, PLASMA: Lactic Acid, Venous: 0.5 mmol/L (ref 0.5–2.2)

## 2012-11-06 MED ORDER — ALBUTEROL SULFATE HFA 108 (90 BASE) MCG/ACT IN AERS
2.0000 | INHALATION_SPRAY | RESPIRATORY_TRACT | Status: DC | PRN
  Filled 2012-11-06: qty 6.7

## 2012-11-06 MED ORDER — NAPROXEN 500 MG PO TABS
500.0000 mg | ORAL_TABLET | Freq: Two times a day (BID) | ORAL | Status: DC | PRN
  Administered 2012-11-07: 500 mg via ORAL
  Filled 2012-11-06: qty 1

## 2012-11-06 MED ORDER — SODIUM CHLORIDE 0.9 % IJ SOLN: 3.0000 mL | Freq: Two times a day (BID) | INTRAMUSCULAR | Status: DC

## 2012-11-06 MED ORDER — WITCH HAZEL-GLYCERIN EX PADS: 1.0000 "application " | MEDICATED_PAD | CUTANEOUS | Status: DC | PRN

## 2012-11-06 MED ORDER — VITAMIN D3 25 MCG (1000 UNIT) PO TABS
1000.0000 [IU] | ORAL_TABLET | Freq: Every day | ORAL | Status: DC
  Administered 2012-11-07: 1000 [IU] via ORAL
  Filled 2012-11-06: qty 1

## 2012-11-06 MED ORDER — DOXAZOSIN MESYLATE 2 MG PO TABS
2.0000 mg | ORAL_TABLET | Freq: Every day | ORAL | Status: DC
  Administered 2012-11-07: 2 mg via ORAL
  Filled 2012-11-06: qty 1

## 2012-11-06 MED ORDER — HYDROCORTISONE 2.5 % RE CREA
TOPICAL_CREAM | Freq: Two times a day (BID) | RECTAL | Status: DC
  Administered 2012-11-07 (×2): via RECTAL
  Filled 2012-11-06: qty 28.35

## 2012-11-06 MED ORDER — ALBUTEROL SULFATE (5 MG/ML) 0.5% IN NEBU
2.5000 mg | INHALATION_SOLUTION | RESPIRATORY_TRACT | Status: DC
  Administered 2012-11-06: 2.5 mg via RESPIRATORY_TRACT
  Filled 2012-11-06: qty 0.5

## 2012-11-06 MED ORDER — GUAIFENESIN-DM 100-10 MG/5ML PO SYRP: 5.0000 mL | ORAL_SOLUTION | ORAL | Status: DC | PRN

## 2012-11-06 MED ORDER — LEVOTHYROXINE SODIUM 100 MCG PO TABS
100.0000 ug | ORAL_TABLET | Freq: Every day | ORAL | Status: DC
  Administered 2012-11-07: 100 ug via ORAL
  Filled 2012-11-06 (×2): qty 1

## 2012-11-06 MED ORDER — LISINOPRIL 5 MG PO TABS
5.0000 mg | ORAL_TABLET | Freq: Every day | ORAL | Status: DC
  Administered 2012-11-06 – 2012-11-07 (×2): 5 mg via ORAL
  Filled 2012-11-06 (×2): qty 1

## 2012-11-06 MED ORDER — NAPROXEN SODIUM 275 MG PO TABS
440.0000 mg | ORAL_TABLET | Freq: Two times a day (BID) | ORAL | Status: DC | PRN
  Filled 2012-11-06: qty 2

## 2012-11-06 MED ORDER — DOCUSATE SODIUM 100 MG PO CAPS
100.0000 mg | ORAL_CAPSULE | Freq: Two times a day (BID) | ORAL | Status: DC
  Administered 2012-11-06 – 2012-11-07 (×2): 100 mg via ORAL
  Filled 2012-11-06 (×3): qty 1

## 2012-11-06 MED ORDER — SODIUM CHLORIDE 0.9 % IJ SOLN: 3.0000 mL | INTRAMUSCULAR | Status: DC | PRN

## 2012-11-06 MED ORDER — LEVOFLOXACIN IN D5W 750 MG/150ML IV SOLN
750.0000 mg | INTRAVENOUS | Status: DC
  Administered 2012-11-06: 750 mg via INTRAVENOUS
  Filled 2012-11-06: qty 150

## 2012-11-06 MED ORDER — VITAMIN B-12 100 MCG PO TABS: 100.0000 ug | ORAL_TABLET | ORAL | Status: DC

## 2012-11-06 MED ORDER — VITAMIN D 1000 UNITS PO CAPS: 1000.0000 [IU] | ORAL_CAPSULE | Freq: Every day | ORAL | Status: DC

## 2012-11-06 MED ORDER — FINASTERIDE 5 MG PO TABS
5.0000 mg | ORAL_TABLET | Freq: Every day | ORAL | Status: DC
  Administered 2012-11-07: 5 mg via ORAL
  Filled 2012-11-06: qty 1

## 2012-11-06 MED ORDER — FOLIC ACID 1 MG PO TABS
1.0000 mg | ORAL_TABLET | Freq: Every day | ORAL | Status: DC
  Administered 2012-11-07: 1 mg via ORAL
  Filled 2012-11-06: qty 1

## 2012-11-06 MED ORDER — SODIUM CHLORIDE 0.9 % IV SOLN: 250.0000 mL | INTRAVENOUS | Status: DC | PRN

## 2012-11-06 MED ORDER — ONDANSETRON HCL 4 MG PO TABS: 4.0000 mg | ORAL_TABLET | Freq: Four times a day (QID) | ORAL | Status: DC | PRN

## 2012-11-06 MED ORDER — MENTHOL 3 MG MT LOZG
1.0000 | LOZENGE | OROMUCOSAL | Status: DC | PRN
  Filled 2012-11-06: qty 9

## 2012-11-06 MED ORDER — ONDANSETRON HCL 4 MG/2ML IJ SOLN: 4.0000 mg | Freq: Four times a day (QID) | INTRAMUSCULAR | Status: DC | PRN

## 2012-11-06 MED ORDER — IPRATROPIUM BROMIDE 0.02 % IN SOLN
0.5000 mg | RESPIRATORY_TRACT | Status: DC
  Administered 2012-11-06: 0.5 mg via RESPIRATORY_TRACT
  Filled 2012-11-06: qty 2.5

## 2012-11-06 MED ORDER — ENOXAPARIN SODIUM 40 MG/0.4ML ~~LOC~~ SOLN
40.0000 mg | SUBCUTANEOUS | Status: DC
  Administered 2012-11-06: 40 mg via SUBCUTANEOUS
  Filled 2012-11-06 (×2): qty 0.4

## 2012-11-06 MED ORDER — ACETAMINOPHEN 500 MG PO TABS
500.0000 mg | ORAL_TABLET | Freq: Four times a day (QID) | ORAL | Status: DC | PRN
  Administered 2012-11-07: 500 mg via ORAL
  Filled 2012-11-06: qty 1

## 2012-11-06 NOTE — ED Notes (Signed)
Pt to xray

## 2012-11-06 NOTE — ED Notes (Signed)
Bed: WA23 Expected date:  Expected time:  Means of arrival:  Comments: ems 

## 2012-11-06 NOTE — Progress Notes (Signed)
Patient declines MyChart activation 

## 2012-11-06 NOTE — ED Provider Notes (Signed)
CSN: [id number]     Arrival date & time 11/06/12  1033 History     First MD Initiated Contact with Patient 11/06/12 1046     No chief complaint on file.  (Consider location/radiation/quality/duration/timing/severity/associated sxs/prior Treatment) Patient is a 79 y.o. male presenting with shortness of breath.  Shortness of Breath Severity:  Mild Onset quality:  Sudden Duration:  2 days Timing:  Constant Progression:  Worsening Chronicity:  New Context: not activity, not occupational exposure, not URI and not weather changes   Relieved by:  Nothing Worsened by:  Nothing tried Ineffective treatments:  None tried Associated symptoms: no abdominal pain, no chest pain, no fever and no vomiting     Past Medical History  Diagnosis Date  . Anemia, unspecified   . 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 Hypertrophy of prostate without urinary obstruction and other lower urinary tract symptoms (LUTS)   . Osteoarthrosis, unspecified whether generalized or localized, unspecified site   . Abnormality of gait   . Dysphagia, unspecified(787.20)   . Stiffness of joints, not elsewhere classified, multiple sites   . Unspecified hypothyroidism   . Hemorrhoids   . Pain in joint, site unspecified   . Other malaise and fatigue   . Slowing of urinary stream   . Pernicious anemia 03/08/2011  . Hypothyroidism 03/08/2011  . BPH (benign prostatic hypertrophy) 03/08/2011  . History of Zenker's diverticulum removal 03/08/2011  . Degenerative arthritis of lumbar spine 03/08/2011  . Other abnormal blood chemistry 05/25/2012  . Personal history of fall 05/25/2012  . Nocturia 03/30/2012  . Neoplasm of uncertain behavior of skin 11/11/2011  . Unspecified essential hypertension 08/05/2011  . Shortness of breath 08/05/2011  . Unspecified urinary incontinence 08/05/2011  . Hypoxemia 08/05/2011  . Blepharochalasis 05/15/2011  . Seborrheic keratosis 02/18/2011  . Pain in  joint, lower leg 02/18/2011  . Myelodysplastic syndrome, unspecified 06/11/2010   Past Surgical History  Procedure Laterality Date  . Tonsillectomy    . Cholecystectomy     Family History  Problem Relation Age of Onset  . Heart disease Father    History  Substance Use Topics  . Smoking status: Never Smoker   . Smokeless tobacco: Never Used  . Alcohol Use: No    Review of Systems  Constitutional: Negative for fever.  Respiratory: Positive for shortness of breath.   Cardiovascular: Negative for chest pain.  Gastrointestinal: Negative for vomiting and abdominal pain.  All other systems reviewed and are negative.    Allergies  Review of patient's allergies indicates no known allergies.  Home Medications   Current Outpatient Rx  Name  Route  Sig  Dispense  Refill  . Cholecalciferol (VITAMIN D) 1000 UNITS capsule   Oral   Take 1,000 Units by mouth daily.           Marland Kitchen docusate sodium (COLACE) 100 MG capsule   Oral   Take 100 mg by mouth. Take one daily         . doxazosin (CARDURA) 2 MG tablet   Oral   Take 2 mg by mouth daily.          . finasteride (PROSCAR) 5 MG tablet   Oral   Take 5 mg by mouth daily.         . folic acid (FOLVITE) 1 MG tablet   Oral   Take 1 tablet (1 mg total) by mouth daily.   30 tablet   1   .  naproxen sodium (ANAPROX) 220 MG tablet   Oral   Take 440 mg by mouth 2 (two) times daily as needed (for pain).          . predniSONE (DELTASONE) 20 MG tablet               . PROAIR HFA 108 (90 BASE) MCG/ACT inhaler   Inhalation   Inhale 2 puffs into the lungs every 4 (four) hours as needed for wheezing or shortness of breath.           BP 114/49  Pulse 78  Temp(Src) 97.6 F (36.4 C) (Oral)  Resp 18  SpO2 100% Physical Exam  Nursing note and vitals reviewed. Constitutional: He appears well-developed and well-nourished. No distress.  HENT:  Head: Normocephalic and atraumatic.  Mouth/Throat: No oropharyngeal exudate.   Eyes: EOM are normal. Pupils are equal, round, and reactive to light.  Neck: Normal range of motion. Neck supple.  Cardiovascular: Normal rate and regular rhythm.  Exam reveals no friction rub.   No murmur heard. Pulmonary/Chest: Effort normal. No respiratory distress. He has wheezes (LML, mild, R lung). He has no rales.  Abdominal: He exhibits no distension. There is no tenderness. There is no rebound.  Musculoskeletal: Normal range of motion. He exhibits no edema.  Neurological: He is alert. He exhibits normal muscle tone. Coordination normal.  Disoriented to the year  Skin: He is not diaphoretic.    ED Course   Procedures (including critical care time)  Labs Reviewed  CBC - Abnormal; Notable for the following:    WBC 2.4 (*)    RBC 2.81 (*)    Hemoglobin 8.8 (*)    HCT 28.6 (*)    MCV 101.8 (*)    RDW 20.0 (*)    Platelets 58 (*)    All other components within normal limits  BASIC METABOLIC PANEL - Abnormal; Notable for the following:    Glucose, Bld 103 (*)    BUN 40 (*)    GFR calc non Af Amer 52 (*)    GFR calc Af Amer 60 (*)    All other components within normal limits  CULTURE, BLOOD (ROUTINE X 2)  CULTURE, BLOOD (ROUTINE X 2)  LACTIC ACID, PLASMA  URINALYSIS, ROUTINE W REFLEX MICROSCOPIC   Dg Chest 2 View  11/06/2012   *RADIOLOGY REPORT*  Clinical Data: Shortness of breath.  Question pneumonia.  CHEST - 2 VIEW  Comparison: 09/07/2012 and 05/30/2010.  Findings: Chronic lung changes appear similar to the prior examination.  This limits for evaluating for underlying mass or infiltrate or pulmonary edema.  Cardiomegaly.  Calcified tortuous aorta.  IMPRESSION: Chronic lung changes appear similar to the prior examination.  This limits for evaluating for underlying mass or infiltrate or pulmonary edema.  Cardiomegaly.  Calcified tortuous aorta.   Original Report Authenticated By: Lacy Duverney, M.D.   1. Shortness of breath   2. Tachypnea   3. HCAP (healthcare-associated  pneumonia)     Date: 11/06/2012  Rate: 76  Rhythm: normal sinus rhythm  QRS Axis: left  Intervals: wide QRS - paced  ST/T Wave abnormalities: normal  Conduction Disutrbances:none  Narrative Interpretation:   Old EKG Reviewed: unchanged   MDM   89M presents fron assisted living/nursing home for low O2 sats. Does not wear oxygen at baseline. Per wife, he has not been acting like himself. Here, O2 dropped to 90% without oxygen and he had associated tachypnea. He is resting comfortably on 3 L [place]. Normal  sinus rhythm on the monitor. L sided rhonchi and wheezes, more so than R. Will give albuterol and check CXR to look for pneumonia.  With CXR unable to discern if PNA present. With tachypnea off oxygen and low O2 sats reported at nursing home, will treat with Levaquin and admit.    Dagmar Hait, MD 11/06/12 [phone]

## 2012-11-06 NOTE — Progress Notes (Signed)
CSW received referral that pt admitted from [facility].  Per MD H&P and conversation with [facility], the pt is a resident of [facility].  CSW to await PT/OT evaluations and complete psychosocial assessment at that time if appropriate.  Jacklynn Kotter, MSW, LCSWA  Clinical Social Work [phone]

## 2012-11-06 NOTE — ED Notes (Signed)
Pt from [facility] and staff reported low O2 sats yesterday but EMS reported pt today in low 90's O2 sats and BP is 126/58. Pt in NAD. Wife reported to EMS that pt has not been himself lately.

## 2012-11-06 NOTE — ED Notes (Signed)
Removed O2 per EDP. Sats fell to 90 and respirations increased to 35. Pt back on 3 liters of O2 and Sats 98% and and Resp 18

## 2012-11-06 NOTE — Care Management Note (Signed)
   CARE MANAGEMENT NOTE 11/06/2012  Patient:  Dwayne Stuart, Dwayne Stuart   Account Number:  [id number]  Date Initiated:  11/06/2012  Documentation initiated by:  Tayona Sarnowski  Subjective/Objective Assessment:   79 yo male admitted with hypoxia. PTA pt from home with Home Health Services.PCP: Kimber Relic, MD     Action/Plan:   Home when stable   Anticipated DC Date:     Anticipated DC Plan:  HOME W HOME HEALTH SERVICES      DC Planning Services  CM consult      Choice offered to / List presented to:  NA   DME arranged  NA      DME agency  NA     HH arranged  HH-1 RN      Status of service:  In process, will continue to follow Medicare Important Message given?   (If response is "NO", the following Medicare IM given date fields will be blank) Date Medicare IM given:   Date Additional Medicare IM given:    Discharge Disposition:    Per UR Regulation:  Reviewed for med. necessity/level of care/duration of stay  If discussed at Long Length of Stay Meetings, dates discussed:    Comments:  11/06/12 1513 Chetara Kropp,RN,MSN [phone] chart reviewed for utilization of services. Pt will require resumption of care order at discharge. Pt from home with spouse. PCP: Kimber Relic, MD

## 2012-11-06 NOTE — ED Notes (Signed)
Pt unable to provide urine sample at this time 

## 2012-11-06 NOTE — H&P (Addendum)
[facility] History and Physical  GILES CURRIE OZH:[id number] DOB: [birth year] DOA: 11/06/2012  Referring physician: Dr.  Marena Chancy PCP: Kimber Relic, MD   Chief Complaint: Shortness of breath   History of Present Illness: Dwayne Stuart is an 79 y.o. male with a PMH of MDS managed by Dr. Cyndie Chime, history of transfusion dependency, now being managed with Aranesp given Q 3 weeks and chronic systolic and diastolic congestive heart failure, EF 25-30% based on echocardiogram done 09/05/2012. He has had a cough x a few months, and some progressive dyspnea over the past year.  Wife called RN who called Dr. Chilton Si who recommended he be evaluated in the ER secondary to low pulse oximetry.  The patient himself feels that he is at his usual baseline. He specifically denies chest pain, and although he denies shortness of breath, his wife reports progressive shortness of breath over the past year. He has had a dry cough for several weeks. He has had weakness. The patient's wife also reports weight loss over the past year but cannot specify the amount. Workup in the emergency department was essentially unremarkable with a chest x-ray only significant for chronic changes, and pancytopenia consistent with his history of MDS. I spoke with Dr. Cyndie Chime who confirms that his current blood counts are consistent with previously known values and are unlikely to be contributory to his current presentation. Although he was reported to be hypoxic by his home health nurse, his current oxygen saturations have been 99-100% on low-flow oxygen here.  Review of Systems: Constitutional: No fever, no chills;  Appetite diminished; + weight loss over the past year, no weight gain, + fatigue/weakness.  HEENT: No blurry vision, no diplopia, no pharyngitis, no dysphagia CV: No chest pain, no palpitations.  Resp: + SOB, + dry cough. GI: No nausea, no vomiting, no diarrhea, no melena, no hematochezia.  GU: No dysuria, no  hematuria. MSK: + myalgias, no arthralgias.  Neuro:  No headache, no focal neurological deficits, no history of seizures.  Psych: No depression, no anxiety.  Endo: No heat intolerance, no cold intolerance, no polyuria, no polydipsia  Skin: No rashes, no skin lesions.  Heme: No easy bruising.  Past Medical History Past Medical History  Diagnosis Date  . Anemia, unspecified   . 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 Hypertrophy of prostate without urinary obstruction and other lower urinary tract symptoms (LUTS)   . Osteoarthrosis, unspecified whether generalized or localized, unspecified site   . Abnormality of gait   . Dysphagia, unspecified(787.20)   . Stiffness of joints, not elsewhere classified, multiple sites   . Unspecified hypothyroidism   . Hemorrhoids   . Pain in joint, site unspecified   . Other malaise and fatigue   . Slowing of urinary stream   . Pernicious anemia 03/08/2011  . Hypothyroidism 03/08/2011  . BPH (benign prostatic hypertrophy) 03/08/2011  . History of Zenker's diverticulum removal 03/08/2011  . Degenerative arthritis of lumbar spine 03/08/2011  . Other abnormal blood chemistry 05/25/2012  . Personal history of fall 05/25/2012  . Nocturia 03/30/2012  . Neoplasm of uncertain behavior of skin 11/11/2011  . Unspecified essential hypertension 08/05/2011  . Shortness of breath 08/05/2011  . Unspecified urinary incontinence 08/05/2011  . Hypoxemia 08/05/2011  . Blepharochalasis 05/15/2011  . Seborrheic keratosis 02/18/2011  . Pain in joint, lower leg 02/18/2011  . Myelodysplastic syndrome, unspecified 06/11/2010     Past Surgical History Past Surgical History  Procedure Laterality Date  . Tonsillectomy    . Cholecystectomy       Social History: History   Social History  . Marital Status: Married    Spouse Name: N/A    Number of Children: N/A  . Years of Education: N/A   Occupational History  . Retired Environmental manager     Social History Main Topics  . Smoking status: Former Smoker    Types: Pipe  . Smokeless tobacco: Never Used  . Alcohol Use: No  . Drug Use: No  . Sexual Activity: No   Other Topics Concern  . Not on file   Social History Narrative   Lives with his wife at [facility].  Non-ambulatory, uses a scooter to get around.    Family History:  Family History  Problem Relation Age of Onset  . Heart disease Father     Allergies: Review of patient's allergies indicates no known allergies.  Meds: Prior to Admission medications   Medication Sig Start Date End Date Taking? Authorizing Provider  acetaminophen (TYLENOL) 500 MG tablet Take 500 mg by mouth every 6 (six) hours as needed for pain.   Yes Historical Provider, MD  Cholecalciferol (VITAMIN D) 1000 UNITS capsule Take 1,000 Units by mouth daily.     Yes Historical Provider, MD  cyanocobalamin 1000 MCG tablet Take 100 mcg by mouth 2 (two) times a week.   Yes Historical Provider, MD  darbepoetin (ARANESP) 300 MCG/0.6ML SOLN injection Inject 300 mcg into the skin every 21 ( twenty-one) days.   Yes Historical Provider, MD  docusate sodium (COLACE) 100 MG capsule Take 100 mg by mouth 2 (two) times daily. Take one daily   Yes Historical Provider, MD  doxazosin (CARDURA) 2 MG tablet Take 2 mg by mouth daily.    Yes Historical Provider, MD  finasteride (PROSCAR) 5 MG tablet Take 5 mg by mouth daily.   Yes Historical Provider, MD  folic acid (FOLVITE) 1 MG tablet Take 1 tablet (1 mg total) by mouth daily. 10/11/12  Yes [name], MD  hydrocortisone (ANUSOL-HC) 2.5 % rectal cream Place rectally 2 (two) times daily.   Yes Historical Provider, MD  levothyroxine (SYNTHROID, LEVOTHROID) 100 MCG tablet Take 100 mcg by mouth daily before breakfast.   Yes Historical Provider, MD  naproxen sodium (ANAPROX) 220 MG tablet Take 440 mg by mouth 2 (two) times daily as needed (for pain).    Yes Historical Provider, MD  PROAIR HFA  108 (90 BASE) MCG/ACT inhaler Inhale 2 puffs into the lungs every 4 (four) hours as needed for wheezing or shortness of breath.  08/05/11  Yes Historical Provider, MD  witch hazel-glycerin (TUCKS) pad Place 1 application rectally as needed for pain.   Yes Historical Provider, MD    Physical Exam: Filed Vitals:   11/06/12 1043 11/06/12 1327 11/06/12 1419  BP: 114/49 123/55 111/51  Pulse: 78 74 72  Temp: 97.6 F (36.4 C)  97.6 F (36.4 C)  TempSrc: Oral  Oral  Resp: 18 17 18   SpO2: 100% 99% 100%     Physical Exam: Blood pressure 111/51, pulse 72, temperature 97.6 F (36.4 C), temperature source Oral, resp. rate 18, SpO2 100.00%. Gen: No acute distress. Head: Normocephalic, atraumatic. Eyes: PERRL, EOMI, sclerae nonicteric. Mouth: Oropharynx with slightly dry mucous membranes. Neck: Supple, no thyromegaly, no lymphadenopathy, no jugular venous distention. Chest: Lungs are diminished in the bases but no rales, rhonchi or wheezes appreciated. CV: Heart sounds are regular. No murmurs,  rubs, or gallops. Abdomen: Soft, nontender, nondistended with normal active bowel sounds. Extremities: Extremities are without clubbing, edema, or cyanosis. Skin: Warm and dry. Neuro: Alert and oriented times 2; cranial nerves II through XII grossly intact. Psych: Mood and affect normal.  Labs on Admission:  Basic Metabolic Panel:  Recent Labs Lab 11/06/12 1106  NA 139  K 4.3  CL 106  CO2 29  GLUCOSE 103*  BUN 40*  CREATININE 1.16  CALCIUM 9.0   Liver Function Tests: No results found for this basename: AST, ALT, ALKPHOS, BILITOT, PROT, ALBUMIN,  in the last 168 hours No results found for this basename: LIPASE, AMYLASE,  in the last 168 hours No results found for this basename: AMMONIA,  in the last 168 hours CBC:  Recent Labs Lab 11/06/12 1106  WBC 2.4*  HGB 8.8*  HCT 28.6*  MCV 101.8*  PLT 58*   BNP (last 3 results)  Recent Labs  09/04/12 1804  PROBNP 1576.0*     Radiological Exams on Admission: Dg Chest 2 View  11/06/2012   *RADIOLOGY REPORT*  Clinical Data: Shortness of breath.  Question pneumonia.  CHEST - 2 VIEW  Comparison: 09/07/2012 and 05/30/2010.  Findings: Chronic lung changes appear similar to the prior examination.  This limits for evaluating for underlying mass or infiltrate or pulmonary edema.  Cardiomegaly.  Calcified tortuous aorta.  IMPRESSION: Chronic lung changes appear similar to the prior examination.  This limits for evaluating for underlying mass or infiltrate or pulmonary edema.  Cardiomegaly.  Calcified tortuous aorta.   Original Report Authenticated By: Lacy Duverney, M.D.    EKG: Independently reviewed. Normal sinus rhythm at 76 beats per minute with PVCs, IVCD, and possible atypical LBBB.   Assessment/Plan Principal Problem:   Hypoxia -Not currently hypoxic but has a history of systolic/diastolic congestive heart failure, pulmonary hypertension. May simply need home oxygen therapy. Check pro BNP. -Check ABG on room air to assess further. Set up home oxygen if indicated. -No convincing evidence of pneumonia as patient has had a chronic dry cough and progressive dyspnea over the course of a year. No fevers. Patient feels well overall. Discontinue empiric antibiotics. Active Problems:   Myelodysplastic syndrome, unspecified -Pancytopenia appears to be stable and consistent with his usual blood counts. -Dr. Cyndie Chime aware of the patient's admission. -No current indication for blood transfusion. Chronic anemia managed with Aranesp.   Hypothyroidism -Continue usual dose of Synthroid. Check TSH to ensure he is appropriately replaced given fatigue.   BPH (benign prostatic hypertrophy) -Continue Cardura and Proscar.   Severe protein-calorie malnutrition -Dietitian consultation for assessment of nutritional status and needs.   Combined congestive systolic and diastolic heart failure -Check pro BNP. No evidence of clinical  decompensation. -Not on ACE-I so will initiate low dose Lisinopril.  Code Status: DNR Family Communication: Odel Schmid (wife) updated by telephone. Disposition Plan: Home when stable.  Time spent: 1 hour.  RAMA,CHRISTINA [facility] Pager [phone]  If 7PM-7AM, please contact night-coverage [email or web address] Password [facility] 11/06/2012, 2:45 PM

## 2012-11-07 LAB — TSH: TSH: 0.853 u[IU]/mL (ref 0.350–4.500)

## 2012-11-07 MED ORDER — LISINOPRIL 5 MG PO TABS: 5.0000 mg | ORAL_TABLET | Freq: Every day | ORAL | Status: DC

## 2012-11-07 MED ORDER — DEXTROMETHORPHAN POLISTIREX 30 MG/5ML PO LQCR: 60.0000 mg | ORAL | Status: AC | PRN

## 2012-11-07 NOTE — Progress Notes (Signed)
Patient given all discharge instructions, with follow up appointments.  Patient verbalized an understanding of all discharge instructions, and will be transported home via ambulance, with oxygen in place.  Philomena Doheny RN

## 2012-11-07 NOTE — Progress Notes (Signed)
SATURATION QUALIFICATIONS: (This note is used to comply with regulatory documentation for home oxygen)  Patient Saturations on Room Air at Rest = 90%  Patient Saturations on Room Air while Ambulating = 82%  Patient Saturations on 3 Liters of oxygen while Ambulating = 95%  Please briefly explain why patient needs home oxygen: Pt quickly desaturates with activity on room air and requires more assist with mobility.  With supplemental O2 support, pt O2 sats are at 95% and pt requires less assist for mobility  Teresa K. Manson Passey,  [phone]

## 2012-11-07 NOTE — Discharge Summary (Signed)
Physician Discharge Summary  Dwayne Stuart ZOX:[id number] DOB: [birth year] DOA: 11/06/2012  PCP: Kimber Relic, MD  Admit date: 11/06/2012 Discharge date: 11/07/2012  Recommendations for Outpatient Follow-up:  1. Home oxygen therapy set up. 2. Note: Lisinopril started per core measures for congestive heart failure. Recommend recheck of renal function and electrolytes and 1-2 weeks. 3. Blood cultures obtained on admission, negative to date, but please followup final results.  Discharge Diagnoses:  Principal Problem:   Hypoxia, multifactorial, with anemia, chronic systolic/diastolic heart failure and pulmonary hypertension contributory Active Problems:    Myelodysplastic syndrome, unspecified    Hypothyroidism    BPH (benign prostatic hypertrophy)    Severe protein-calorie malnutrition    Combined congestive systolic and diastolic heart failure  Discharge Condition: Stable.  Diet recommendation: Low-sodium, heart healthy.  History of present illness:  Dwayne Stuart is an 79 y.o. male with a PMH of MDS managed by Dr. Cyndie Chime, history of transfusion dependency, now being managed with Aranesp given Q 3 weeks and chronic systolic and diastolic congestive heart failure, EF 25-30% based on echocardiogram done 09/05/2012. He has had a cough x a few months, and some progressive dyspnea over the past year. Wife called RN who called Dr. Chilton Si who recommended he be evaluated in the ER secondary to low pulse oximetry. The patient himself feels that he is at his usual baseline. He specifically denies chest pain, and although he denies shortness of breath, his wife reports progressive shortness of breath over the past year.   Hospital Course by problem:  Principal Problem:  Hypoxia  -Not currently hypoxic but has a history of systolic/diastolic congestive heart failure, pulmonary hypertension. May simply need home oxygen therapy. ProBNP chronically elevated.  -ABG without hypoxia or  hypercarbia, however the patient desaturates into the upper 80s with activity, so would benefit from home oxygen therapy which was set up. -No convincing evidence of pneumonia as patient has had a chronic dry cough and progressive dyspnea over the course of a year. No fevers. Patient feels well overall. Chest x-ray stable without acute infiltrates.  Active Problems:  Myelodysplastic syndrome, unspecified  -Pancytopenia appears to be stable and consistent with his usual blood counts.  -Dr. Cyndie Chime aware of the patient's admission.  -No current indication for blood transfusion. Chronic anemia managed with Aranesp.  Hypothyroidism  -Continue usual dose of Synthroid. TSH within normal limits. BPH (benign prostatic hypertrophy)  -Continue Cardura and Proscar.  Severe protein-calorie malnutrition  -Dietitian consultation for assessment of nutritional status and needs.  Combined congestive systolic and diastolic heart failure  -ProBNP on a clean elevated, patient without evidence of clinical decompensation.  -Not on ACE-I so was started on low dose Lisinopril.  Discharge Exam: Filed Vitals:   11/07/12 0540  BP: 105/38  Pulse: 72  Temp: 97.9 F (36.6 C)  Resp: 16   Filed Vitals:   11/06/12 1419 11/06/12 1945 11/06/12 2137 11/07/12 0540  BP: 111/51  105/41 105/38  Pulse: 72 76 73 72  Temp: 97.6 F (36.4 C)  97.9 F (36.6 C) 97.9 F (36.6 C)  TempSrc: Oral  Oral Oral  Resp: 18  16 16   SpO2: 100%  99% 100%    Gen:  NAD Cardiovascular:  RRR, No M/R/G Respiratory: Lungs CTAB Gastrointestinal: Abdomen soft, NT/ND with normal active bowel sounds. Extremities: No C/E/C   Discharge Instructions  Discharge Orders   Future Appointments Provider Department Dept Phone   11/21/2012 1:00 PM Mauri [place] [facility]  [phone]   11/21/2012 1:30 PM [facility] In[facility] Nurse [place] [facility] [phone]   12/11/2012 1:00 PM Delcie Roch [facility] [phone]   12/11/2012 1:30 PM [facility] Nurse [place] [facility] [phone]   01/01/2013 12:00 PM Windell Hum[facility]bird [facility] MEDICAL ONCOLOGY [phone]   01/01/2013 12:30 PM [name], MD [facility] MEDICAL ONCOLOGY [phone]   01/01/2013 1:30 PM [facility] [phone]   01/04/2013 2:15 PM Kimber Relic, MD [facility] [phone]   Future Orders Complete By Expires   Face-to-face encounter (required for Medicare/Medicaid patients)  As directed    Comments:     I Llewelyn Sheaffer certify that this patient is under my care and that I, or a nurse practitioner or physician's assistant working with me, had a face-to-face encounter that meets the physician face-to-face encounter requirements with this patient on 11/07/2012. The encounter with the patient was in whole, or in part for the following medical condition(s) which is the primary reason for home health care (List medical condition): Hypoxia, multifactorial with chronic heart failure and pulmonary hypertension contributory.   Questions:     The encounter with the patient was in whole, or in part, for the following medical condition, which is the primary reason for home health care:  Hypoxia   I certify that, based on my findings, the following services are medically necessary home health services:  Nursing   Physical therapy   My clinical findings support the need for the above services:  Shortness of breath with activity   Further, I certify that my clinical findings support that this patient is homebound due to:  Unable to leave home safely without assistance   Reason for Medically Necessary Home Health Services:  Skilled Nursing- Skilled Assessment/Observation   Therapy- Home Adaptation to Facilitate Safety   For home use only DME oxygen  As directed     Questions:     Mode or (Route):  Nasal cannula   Liters per Minute:     Frequency:  Continuous (stationary and portable oxygen unit needed)   Oxygen conserving device:  Yes   Home Health  As directed    Questions:     To provide the following care/treatments:  PT   RN       Medication List         acetaminophen 500 MG tablet  Commonly known as:  TYLENOL  Take 500 mg by mouth every 6 (six) hours as needed for pain.     cyanocobalamin 1000 MCG tablet  Take 100 mcg by mouth 2 (two) times a week.     darbepoetin 300 MCG/0.6ML Soln injection  Commonly known as:  ARANESP  Inject 300 mcg into the skin every 21 ( twenty-one) days.     dextromethorphan 30 MG/5ML liquid  Commonly known as:  DELSYM  Take 10 mL (60 mg total) by mouth as needed for cough.     docusate sodium 100 MG capsule  Commonly known as:  COLACE  Take 100 mg by mouth 2 (two) times daily. Take one daily     doxazosin 2 MG tablet  Commonly known as:  CARDURA  Take 2 mg by mouth daily.     finasteride 5 MG tablet  Commonly known as:  PROSCAR  Take 5 mg by mouth daily.     folic acid 1 MG tablet  Commonly known as:  FOLVITE  Take 1 tablet (1 mg total) by mouth daily.     hydrocortisone 2.5 % rectal cream  Commonly known as:  ANUSOL-HC  Place rectally 2 (two) times daily.     levothyroxine 100 MCG tablet  Commonly known as:  SYNTHROID, LEVOTHROID  Take 100 mcg by mouth daily before breakfast.     lisinopril 5 MG tablet  Commonly known as:  PRINIVIL,ZESTRIL  Take 1 tablet (5 mg total) by mouth daily.     naproxen sodium 220 MG tablet  Commonly known as:  ANAPROX  Take 440 mg by mouth 2 (two) times daily as needed (for pain).     PROAIR HFA 108 (90 BASE) MCG/ACT inhaler  Generic drug:  albuterol  Inhale 2 puffs into the lungs every 4 (four) hours as needed for wheezing or shortness of breath.     Vitamin D 1000 UNITS capsule  Take 1,000 Units by mouth daily.     witch hazel-glycerin pad   Commonly known as:  TUCKS  Place 1 application rectally as needed for pain.           Follow-up Information   Follow up with GREEN, Lenon Curt, MD. (At your scheduled appointment time noted below)    Specialty:  Internal Medicine   Contact information:   [address] [place] [address] [phone]       Follow up with [name], MD. (After scheduled appointment time noted below)    Specialty:  Oncology   Contact information:   [address] [place] [address] [phone]        The results of significant diagnostics from this hospitalization (including imaging, microbiology, ancillary and laboratory) are listed below for reference.    Significant Diagnostic Studies: Dg Chest 2 View  11/06/2012   *RADIOLOGY REPORT*  Clinical Data: Shortness of breath.  Question pneumonia.  CHEST - 2 VIEW  Comparison: 09/07/2012 and 05/30/2010.  Findings: Chronic lung changes appear similar to the prior examination.  This limits for evaluating for underlying mass or infiltrate or pulmonary edema.  Cardiomegaly.  Calcified tortuous aorta.  IMPRESSION: Chronic lung changes appear similar to the prior examination.  This limits for evaluating for underlying mass or infiltrate or pulmonary edema.  Cardiomegaly.  Calcified tortuous aorta.   Original Report Authenticated By: Lacy Duverney, M.D.    Labs:  Basic Metabolic Panel:  Recent Labs Lab 11/06/12 1106 11/06/12 1630  NA 139  --   K 4.3  --   CL 106  --   CO2 29  --   GLUCOSE 103*  --   BUN 40*  --   CREATININE 1.16 1.05  CALCIUM 9.0  --    GFR The CrCl is unknown because both a height and weight (above a minimum accepted value) are required for this calculation. CBC:  Recent Labs Lab 11/06/12 1106 11/06/12 1630  WBC 2.4* 2.2*  HGB 8.8* 8.5*  HCT 28.6* 27.8*  MCV 101.8* 102.2*  PLT 58* 55*   Thyroid function studies  Recent Labs  11/06/12 1631  TSH 0.853   Anemia work up No results found for  this basename: VITAMINB12, FOLATE, FERRITIN, TIBC, IRON, RETICCTPCT,  in the last 72 hours Microbiology Recent Results (from the past 240 hour(s))  CULTURE, BLOOD (ROUTINE X 2)     Status: None   Collection Time    11/06/12 12:55 PM      Result Value Range Status   Specimen Description  BLOOD RIGHT ANTECUBITAL   Final   Special Requests BOTTLES DRAWN AEROBIC AND ANAEROBIC   Final   Culture  Setup Time     Final   Value: 11/06/2012 16:48     Performed at [facility]   Culture     Final   Value:        BLOOD CULTURE RECEIVED NO GROWTH TO DATE CULTURE WILL BE HELD FOR 5 DAYS BEFORE ISSUING A FINAL NEGATIVE REPORT     Performed at [facility]   Report Status PENDING   Incomplete  CULTURE, BLOOD (ROUTINE X 2)     Status: None   Collection Time    11/06/12  1:00 PM      Result Value Range Status   Specimen Description BLOOD RIGHT FOREARM   Final   Special Requests BOTTLES DRAWN AEROBIC ONLY   Final   Culture  Setup Time     Final   Value: 11/06/2012 16:49     Performed at [facility]   Culture     Final   Value:        BLOOD CULTURE RECEIVED NO GROWTH TO DATE CULTURE WILL BE HELD FOR 5 DAYS BEFORE ISSUING A FINAL NEGATIVE REPORT     Performed at [facility]   Report Status PENDING   Incomplete    Time coordinating discharge: 35 minutes.  Signed:  Diasia Henken  Pager [phone] [facility] 11/07/2012, 10:39 AM

## 2012-11-07 NOTE — Evaluation (Signed)
Occupational Therapy Evaluation Patient Details Name: Dwayne Stuart MRN: [id number] DOB: [birth year] Today's Date: 11/07/2012 Time: 6962-9528 OT Time Calculation (min): 19 min  OT Assessment / Plan / Recommendation History of present illness Dwayne Stuart is an 79 y.o. male with a PMH of MDS managed by Dr. Cyndie Chime, history of transfusion dependency, now being managed with Aranesp given Q 3 weeks and chronic systolic and diastolic congestive heart failure, EF 25-30% based on echocardiogram done 09/05/2012. He has had a cough x a few months, and some progressive dyspnea over the past year.  Wife called RN who called Dr. Chilton Si who recommended he be evaluated in the ER secondary to low pulse oximetry.   Clinical Impression   Pt was admitted with the above.  He is being discharged home today to independent living with wife; he would benefit from [facility]  He would also be appropriate for the skilled unit at [facility].      OT Assessment  All further OT needs can be met in the next venue of care (pt plans d/c today)    Follow Up Recommendations  Home health OT;SNF (pt being discharged home; would benefit from SNF unit )    Barriers to Discharge      Equipment Recommendations    3:1 commode   Recommendations for Other Services    Frequency       Precautions / Restrictions Precautions Precautions: Fall Restrictions Other Position/Activity Restrictions:  pt reports he does not walk, he just 'slides over" to his power chair   Pertinent Vitals/Pain No pain reported    ADL  Grooming: Set up Where Assessed - Grooming: Supported sitting Upper Body Bathing: Set up Where Assessed - Upper Body Bathing: Supported sitting Lower Body Bathing: Minimal assistance Where Assessed - Lower Body Bathing: Supported sit to stand Upper Body Dressing: Set up Where Assessed - Upper Body Dressing: Supported sitting Lower Body Dressing: Minimal assistance Where Assessed - Lower Body Dressing: Supported  sit to Pharmacist, hospital: Minimal assistance (for sit to stand; min guard spt) Statistician Method: Surveyor, minerals: Materials engineer and Hygiene: Moderate assistance Where Assessed - Toileting Clothing Manipulation and Hygiene: Sit to stand from 3-in-1 or toilet Transfers/Ambulation Related to ADLs: assist to rise from recliner (min A); steadying from 3:1 ADL Comments: Pt unable to don socks; chair height high.  He doesn't have 3:1 at home and he had urgency when I was there.  He needs assist with hygiene    OT Diagnosis: Generalized weakness  OT Problem List: Decreased strength;Decreased activity tolerance;Impaired balance (sitting and/or standing);Decreased knowledge of use of DME or AE;Cardiopulmonary status limiting activity OT Treatment Interventions:     OT Goals(Current goals can be found in the care plan section) Acute Rehab OT Goals Patient Stated Goal: to go back home  Visit Information  Last OT Received On: 11/07/12 Assistance Needed: +1 History of Present Illness: Dwayne Stuart is an 79 y.o. male with a PMH of MDS managed by Dr. Cyndie Chime, history of transfusion dependency, now being managed with Aranesp given Q 3 weeks and chronic systolic and diastolic congestive heart failure, EF 25-30% based on echocardiogram done 09/05/2012. He has had a cough x a few months, and some progressive dyspnea over the past year.  Wife called RN who called Dr. Chilton Si who recommended he be evaluated in the ER secondary to low pulse oximetry.       Prior Functioning  Home Living Family/patient expects to be discharged to:: Private residence Living Arrangements: Spouse/significant other Home Equipment: Environmental consultant - 2 wheels;Wheelchair - power Additional Comments: pt has been mostly independent.  He uses scooter to get to bathroom Prior Function Level of Independence: Independent with assistive  device(s) Communication Communication: HOH Dominant Hand: Right         Vision/Perception     Cognition  Cognition Arousal/Alertness: Awake/alert Behavior During Therapy: WFL for tasks assessed/performed Overall Cognitive Status: Within Functional Limits for tasks assessed    Extremity/Trunk Assessment Upper Extremity Assessment Upper Extremity Assessment: Generalized weakness     Mobility Transfers Sit to Stand: 4: Min assist;4: Min guard Stand to Sit: 5: Supervision Details for Transfer Assistance: assist to rise from recliner; guarding from 3:1     Exercise    Balance     End of Session OT - End of Session Activity Tolerance: Patient tolerated treatment well Patient left: in chair;with call bell/phone within reach  GO Functional Assessment Tool Used: clinical judgment/observation Functional Limitation: Self care Self Care Current Status (W0981): At least 20 percent but less than 40 percent impaired, limited or restricted Self Care Goal Status (X9147): At least 20 percent but less than 40 percent impaired, limited or restricted Self Care Discharge Status [phone]): At least 20 percent but less than 40 percent impaired, limited or restricted   Tukker Byrns 11/07/2012, 1:49 PM Marica Otter, OTR/L [phone] 11/07/2012

## 2012-11-07 NOTE — Progress Notes (Signed)
Clinical Social Work Department BRIEF PSYCHOSOCIAL ASSESSMENT 11/07/2012  Patient:  Dwayne Stuart, Dwayne Stuart     Account Number:  [id number]     Admit date:  11/06/2012  Clinical Social Worker:  Jacelyn Grip  Date/Time:  11/07/2012 10:40 AM  Referred by:  Physician  Date Referred:  11/07/2012 Referred for  Other - See comment   Other Referral:   Patient admitted from [facility]   Interview type:  Patient Other interview type:    PSYCHOSOCIAL DATA Living Status:  FACILITY Admitted from facility:  [facility] Level of care:  Independent Living Primary support name:  [name] Pellow/spouse Primary support relationship to patient:  SPOUSE Degree of support available:   adequate    CURRENT CONCERNS Current Concerns  Other - See comment   Other Concerns:   patient admitted from [facility]    SOCIAL WORK ASSESSMENT / PLAN CSW reviewed chart and noted that PT eval stated that recommendation was for Home Health PT, but pt may be interested in ST SNF at [facility].    CSW and RNCM met with pt at bedside to discuss. CSW discussed that PT note had mentioned that pt may be interested in ST SNF at [facility] vs returning home to ILF apartment with [facility] services. Pt reports that he would like to return to [facility] with pt wife and [facility] services.    Pt aware that if he decides that he would like to try ST SNF placement then the facility can assist him in transferring over to rehab portion of [facility].    Pt request CSW contact [facility] re: if facility could transport pt back to ILF apartment. CSW contacted [facility] and plan to communicate with facility about hopefully facility being able to provide transportation for pt at d/c today.   Assessment/plan status:  Psychosocial Support/Ongoing Assessment of Needs Other assessment/ plan:   Information/referral to  community resources:   Referral to [facility] for [facility] needs    PATIENT'S/FAMILY'S RESPONSE TO PLAN OF CARE: Pt alert and oriented x 4. Pt eager to return to [facility] apartment with pt wife and support from [facility] services. CSW to assist with transportation for pt at d/c.       Jacklynn Kotz, MSW, LCSWA  Clinical Social Work [phone]

## 2012-11-07 NOTE — Care Management Note (Signed)
Cm spoke with patient at bedside with CSW present concerning discharge planning. Pt recommends HHPT vs short term SNF. Per dc plans include home with [facility] services. [facility] has on-site [facility] services. Cm spoke with facility RN Corey Harold @ [phone] concerning pt's discharge with home oxygen therapy & HHPT. CM faxed AVS & MD orders for [facility] to [facility] to [phone]. Confirmation received. Cm contacted [facility] concerning home oxygen order. MD orders faxed to [employer] at [phone]. Confirmation received.   Roxy Manns Martrice Apt,RN,MSN [phone]

## 2012-11-07 NOTE — Progress Notes (Signed)
CSW arranged ambulance transportation for pt back to [facility] (Service Request ID#: [id number])  CSW received authorization for ambulance transportation from pt insurance company. CSW confirmed with pt wife that O2 had been delivered to pt home.   CSW discussed with RN and pt at bedside.  No further social work needs identified at this time.  CSW signing off.   Jacklynn Memon, MSW, LCSWA  Clinical Social Work [phone]

## 2012-11-07 NOTE — Evaluation (Signed)
Physical Therapy Evaluation Patient Details Name: Dwayne Stuart MRN: [id number] DOB: 04-10-1914 Today's Date: 11/07/2012 Time: 2841-3244 PT Time Calculation (min): 35 min  PT Assessment / Plan / Recommendation History of Present Illness  Dwayne Stuart is an 79 y.o. male with a PMH of MDS managed by Dr. Cyndie Chime, history of transfusion dependency, now being managed with Aranesp given Q 3 weeks and chronic systolic and diastolic congestive heart failure, EF 25-30% based on echocardiogram done 09/05/2012. He has had a cough x a few months, and some progressive dyspnea over the past year.  Wife called RN who called Dr. Chilton Si who recommended he be evaluated in the ER secondary to low pulse oximetry.  Clinical Impression  Mr Koury presents with generalized weakness and muscle atrophy, but has potential to increase strength and maintain functional independence with follow up PT either with short term SNF at [facility] or HHPT with occasional extra assist. He requires supplemental O2 to maintain oxygen satureation > 90%    PT Assessment  Patient needs continued PT services    Follow Up Recommendations  Home health PT (discussed possiblity of short term SNF-Pt is interested)    Does the patient have the potential to tolerate intense rehabilitation      Barriers to Discharge        Equipment Recommendations  None recommended by PT    Recommendations for Other Services OT consult   Frequency Min 3X/week    Precautions / Restrictions Precautions Precautions: Fall Restrictions Weight Bearing Restrictions: No Other Position/Activity Restrictions:  pt reports he does not walk, he just 'slides over" to his power chair   Pertinent Vitals/Pain Some pain with movement of LEs      Mobility  Bed Mobility Bed Mobility: Supine to Sit;Sit to Supine Supine to Sit: 6: Modified independent (Device/Increase time);HOB flat;With rails Details for Bed Mobility Assistance: pt moves slowly   Transfers Transfers: Sit to Stand;Stand to Sit;Stand Pivot Transfers Sit to Stand: 5: Supervision;4: Min assist Stand to Sit: 5: Supervision;4: Min assist Stand Pivot Transfers: 4: Min assist Details for Transfer Assistance: pt says he can do better with chair armrest height he has at home.  Repeated sit to stand x 5 repetitions and pt needed less assist at end of trials .  Repeated stand pivot x 2.  Pt with good use of arms Ambulation/Gait General Gait Details: pt states he does not walk    Exercises General Exercises - Lower Extremity Quad Sets: AROM;5 reps;Standing Gluteal Sets: AROM;5 reps;Standing Other Exercises Other Exercises: deep breathing   PT Diagnosis: Generalized weakness;Difficulty walking  PT Problem List: Decreased strength;Decreased activity tolerance;Decreased mobility;Decreased knowledge of use of DME;Decreased range of motion PT Treatment Interventions: Gait training;Functional mobility training;Therapeutic exercise;Therapeutic activities;Patient/family education     PT Goals(Current goals can be found in the care plan section) Acute Rehab PT Goals Patient Stated Goal: to go back home PT Goal Formulation: With patient Time For Goal Achievement: 11/21/12 Potential to Achieve Goals: Good  Visit Information  Last PT Received On: 11/07/12 History of Present Illness: Dwayne Stuart is an 79 y.o. male with a PMH of MDS managed by Dr. Cyndie Chime, history of transfusion dependency, now being managed with Aranesp given Q 3 weeks and chronic systolic and diastolic congestive heart failure, EF 25-30% based on echocardiogram done 09/05/2012. He has had a cough x a few months, and some progressive dyspnea over the past year.  Wife called RN who called Dr. Chilton Si who recommended he be evaluated  in the ER secondary to low pulse oximetry.       Prior Functioning  Home Living Family/patient expects to be discharged to:: Private residence Living Arrangements: Spouse/significant  other Available Help at Discharge: Family;Other (Comment) (assistance available if needed) Type of Home: Independent living facility Home Access: Level entry Home Layout: One level Home Equipment: Walker - 2 wheels;Wheelchair - power Prior Function Level of Independence: Independent with assistive device(s) (pt describes a stand pivot) Communication Communication: HOH    Cognition  Cognition Arousal/Alertness: Awake/alert Behavior During Therapy: WFL for tasks assessed/performed Overall Cognitive Status: Within Functional Limits for tasks assessed    Extremity/Trunk Assessment Lower Extremity Assessment Lower Extremity Assessment: Generalized weakness;RLE deficits/detail;LLE deficits/detail RLE Deficits / Details: muscle atrophy noted with strength about 2+/5  Pt with some pain with ROM LLE Deficits / Details: muscle atrophy noted with strength about 2+/5  Pt with some pain with ROM Cervical / Trunk Assessment Cervical / Trunk Assessment: Kyphotic;Other exceptions Cervical / Trunk Exceptions: generalized muscle atrophy   Balance Balance Balance Assessed: Yes Static Sitting Balance Static Sitting - Balance Support: No upper extremity supported;Feet supported Static Sitting - Level of Assistance: 7: Independent Static Sitting - Comment/# of Minutes: maintains kyphosis  End of Session PT - End of Session Equipment Utilized During Treatment: Oxygen Activity Tolerance: Patient tolerated treatment well Patient left: in chair;with call bell/phone within reach Nurse Communication: Mobility status  GP Functional Assessment Tool Used: clinical observation Functional Limitation: Mobility: Walking and moving around Mobility: Walking and Moving Around Current Status ([id number]): At least 80 percent but less than 100 percent impaired, limited or restricted Mobility: Walking and Moving Around Goal Status [phone]): At least 40 percent but less than 60 percent impaired, limited or restricted    Rosey Bath K. Manson Passey,  [phone] 11/07/2012, 9:54 AM

## 2012-11-08 ENCOUNTER — Telehealth: Payer: Self-pay | Admitting: Geriatric Medicine

## 2012-11-08 NOTE — Telephone Encounter (Signed)
[name] will transferred to skilled nursing at [facility]. You can see him there later this week. Corey Harold just wanted to let you know.

## 2012-11-10 ENCOUNTER — Encounter: Payer: Self-pay | Admitting: Internal Medicine

## 2012-11-10 DIAGNOSIS — Z299 Encounter for prophylactic measures, unspecified: Secondary | ICD-10-CM

## 2012-11-10 NOTE — Progress Notes (Signed)
Date: 11/10/2012  MRN:  [id number] Name:  Dwayne Stuart Sex:  male Age:  79 y.o. DOB:Oct 08, 1914                    Facility/Room;[facility] room 15 Level Of Care:SNF Provider: Murray Hodgkins, MD  Emergency Contacts: Contact Information   Name Relation Home Work Mobile   Drewes,Evelyn Spouse [phone]        Code Status:DNR MOST Form:  Allergies:No Known Allergies   Chief Complaint  Patient presents with  . Medical Managment of Chronic Issues    New  Admit to SNF comphrensive exam      HPI:  Past Medical History  Diagnosis Date  . Anemia, unspecified   . 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 Hypertrophy of prostate without urinary obstruction and other lower urinary tract symptoms (LUTS)   . Osteoarthrosis, unspecified whether generalized or localized, unspecified site   . Abnormality of gait   . Dysphagia, unspecified(787.20)   . Stiffness of joints, not elsewhere classified, multiple sites   . Unspecified hypothyroidism   . Hemorrhoids   . Pain in joint, site unspecified   . Other malaise and fatigue   . Slowing of urinary stream   . Pernicious anemia 03/08/2011  . Hypothyroidism 03/08/2011  . BPH (benign prostatic hypertrophy) 03/08/2011  . History of Zenker's diverticulum removal 03/08/2011  . Degenerative arthritis of lumbar spine 03/08/2011  . Other abnormal blood chemistry 05/25/2012  . Personal history of fall 05/25/2012  . Nocturia 03/30/2012  . Neoplasm of uncertain behavior of skin 11/11/2011  . Unspecified essential hypertension 08/05/2011  . Shortness of breath 08/05/2011  . Unspecified urinary incontinence 08/05/2011  . Hypoxemia 08/05/2011  . Blepharochalasis 05/15/2011  . Seborrheic keratosis 02/18/2011  . Pain in joint, lower leg 02/18/2011  . Myelodysplastic syndrome, unspecified 06/11/2010    Past Surgical History  Procedure Laterality Date  . Tonsillectomy    . Cholecystectomy    . Repair  zenker's diverticula  1980's    Dr. Lennice Sites     Procedures: Consultants:  Current Outpatient Prescriptions  Medication Sig Dispense Refill  . acetaminophen (TYLENOL) 500 MG tablet Take 500 mg by mouth every 6 (six) hours as needed for pain.      . Cholecalciferol (VITAMIN D) 1000 UNITS capsule Take 1,000 Units by mouth daily.        . cyanocobalamin 1000 MCG tablet Take 100 mcg by mouth 2 (two) times a week.      . darbepoetin (ARANESP) 300 MCG/0.6ML SOLN injection Inject 300 mcg into the skin every 21 ( twenty-one) days.      Marland Kitchen dextromethorphan (DELSYM) 30 MG/5ML liquid Take 10 mL (60 mg total) by mouth as needed for cough.  89 mL  1  . docusate sodium (COLACE) 100 MG capsule Take 100 mg by mouth 2 (two) times daily. Take one daily      . doxazosin (CARDURA) 2 MG tablet Take 2 mg by mouth daily.       . finasteride (PROSCAR) 5 MG tablet Take 5 mg by mouth daily.      . folic acid (FOLVITE) 1 MG tablet Take 1 tablet (1 mg total) by mouth daily.  30 tablet  1  . hydrocortisone (ANUSOL-HC) 2.5 % rectal cream Place rectally 2 (two) times daily.      Marland Kitchen levothyroxine (SYNTHROID, LEVOTHROID) 100 MCG tablet Take 100 mcg by mouth daily before breakfast.      .  lisinopril (PRINIVIL,ZESTRIL) 5 MG tablet Take 1 tablet (5 mg total) by mouth daily.  30 tablet  3  . naproxen sodium (ANAPROX) 220 MG tablet Take 440 mg by mouth 2 (two) times daily as needed (for pain).       Marland Kitchen PROAIR HFA 108 (90 BASE) MCG/ACT inhaler Inhale 2 puffs into the lungs every 4 (four) hours as needed for wheezing or shortness of breath.       . witch hazel-glycerin (TUCKS) pad Place 1 application rectally as needed for pain.       No current facility-administered medications for this visit.    Immunization History  Administered Date(s) Administered  . Influenza Whole 12/21/2011  . Pneumococcal Polysaccharide 08/20/2009     Diet:  History  Substance Use Topics  . Smoking status: Former Smoker    Types: Pipe  .  Smokeless tobacco: Never Used  . Alcohol Use: No    Family History  Problem Relation Age of Onset  . Heart disease Father      Review of Systems    Vital signs: BP 90/56  Pulse 80  Temp(Src) 96.2 F (35.7 C) (Oral)  Resp 20    Screening Score  MMS    PHQ2    PHQ9     Fall Risk    BIMS    Physical Exam  Annual summary: Hospitalizations: Infection History:  Functional assessment: Areas of potential improvement: Rehabilitation Potential: Prognosis for survival: Plan:

## 2012-11-12 LAB — CULTURE, BLOOD (ROUTINE X 2): Culture: NO GROWTH

## 2012-11-13 ENCOUNTER — Encounter: Payer: Self-pay | Admitting: Nurse Practitioner

## 2012-11-13 ENCOUNTER — Non-Acute Institutional Stay (SKILLED_NURSING_FACILITY): Payer: Medicare Other | Admitting: Nurse Practitioner

## 2012-11-13 DIAGNOSIS — I2789 Other specified pulmonary heart diseases: Secondary | ICD-10-CM

## 2012-11-13 DIAGNOSIS — G4700 Insomnia, unspecified: Secondary | ICD-10-CM

## 2012-11-13 DIAGNOSIS — I5040 Unspecified combined systolic (congestive) and diastolic (congestive) heart failure: Secondary | ICD-10-CM

## 2012-11-13 DIAGNOSIS — N400 Enlarged prostate without lower urinary tract symptoms: Secondary | ICD-10-CM

## 2012-11-13 DIAGNOSIS — E039 Hypothyroidism, unspecified: Secondary | ICD-10-CM

## 2012-11-13 DIAGNOSIS — J8410 Pulmonary fibrosis, unspecified: Secondary | ICD-10-CM

## 2012-11-13 DIAGNOSIS — I959 Hypotension, unspecified: Secondary | ICD-10-CM

## 2012-11-13 DIAGNOSIS — R0902 Hypoxemia: Secondary | ICD-10-CM

## 2012-11-13 DIAGNOSIS — I272 Pulmonary hypertension, unspecified: Secondary | ICD-10-CM

## 2012-11-13 DIAGNOSIS — D469 Myelodysplastic syndrome, unspecified: Secondary | ICD-10-CM

## 2012-11-13 NOTE — Assessment & Plan Note (Signed)
Bp 98/54, 78/45, 99/69, 94/50, 60/38, 98/48--dc Lisinopril and Bp and orthostatic BP

## 2012-11-13 NOTE — Progress Notes (Signed)
Patient ID: Dwayne Stuart, male   DOB: [birth year], 79 y.o.   MRN: [id number] Code Status: DNR  No Known Allergies  Chief Complaint  Patient presents with  . Medical Managment of Chronic Issues    yelling out at night for help, hypotension episodes.     HPI: Patient is a 79 y.o. male seen in the SNF at [facility] today for evaluation of  Evaluation of insomnia-yelling for help at night  and other chronic medical conditions.  Problem List Items Addressed This Visit   BPH (benign prostatic hypertrophy)     Stable on Finasteride 5mg  and Doxazosin 2mg .     Combined congestive systolic and diastolic heart failure (Chronic)     Dyspnea, O2 desaturation RA-O2 dependent.     Hypotension, unspecified     Bp 98/54, 78/45, 99/69, 94/50, 60/38, 98/48--dc Lisinopril and Bp and orthostatic BP    Hypothyroidism (Chronic)     Takes Levothyroxine , update TSH and obtain MMSE    Hypoxia     CHF, pulmonary fibrosis, anemia with Hgb 7s, lower Bps all contributed to his O2 desaturation.     Insomnia     Yelling for help at night frequently--add Mirtazapine 7.5mg  daily.     Myelodysplastic syndrome, unspecified (Chronic)     Lower WBC 3.0, Hgb 7.6, takes B12 and folic acid, will repeat CBC and obtain serum Iron.     Postinflammatory pulmonary fibrosis     Chronic dyspnea, O2 dependent, rales posterior mid/lower lungs.     Pulmonary HTN - Primary (Chronic)     O2 desaturation--O2 2lpm [place]       Review of Systems:  Review of Systems  Constitutional: Positive for weight loss and malaise/fatigue. Negative for fever, chills and diaphoresis.  HENT: Positive for hearing loss. Negative for ear pain, nosebleeds, congestion, sore throat, neck pain, tinnitus and ear discharge.   Eyes: Negative for blurred vision, double vision, photophobia, pain, discharge and redness.  Respiratory: Positive for shortness of breath. Negative for cough, hemoptysis, sputum production, wheezing and stridor.    Cardiovascular: Positive for PND. Negative for chest pain, palpitations, orthopnea, claudication and leg swelling.  Gastrointestinal: Negative for heartburn, nausea, vomiting, abdominal pain, diarrhea, constipation, blood in stool and melena.  Genitourinary: Positive for frequency. Negative for dysuria, urgency, hematuria and flank pain.  Musculoskeletal: Negative for myalgias, back pain, joint pain and falls.  Skin: Negative for itching and rash.  Neurological: Positive for weakness (generalized ). Negative for dizziness, tingling, tremors, sensory change, speech change, focal weakness, seizures, loss of consciousness and headaches.  Endo/Heme/Allergies: Negative for environmental allergies and polydipsia. Does not bruise/bleed easily.  Psychiatric/Behavioral: Positive for memory loss. Negative for depression and hallucinations. The patient has insomnia. The patient is not nervous/anxious.      Past Medical History  Diagnosis Date  . Anemia, unspecified   . 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 Hypertrophy of prostate without urinary obstruction and other lower urinary tract symptoms (LUTS)   . Osteoarthrosis, unspecified whether generalized or localized, unspecified site   . Abnormality of gait   . Dysphagia, unspecified(787.20)   . Stiffness of joints, not elsewhere classified, multiple sites   . Unspecified hypothyroidism   . Hemorrhoids   . Pain in joint, site unspecified   . Other malaise and fatigue   . Slowing of urinary stream   . Pernicious anemia 03/08/2011  . Hypothyroidism 03/08/2011  . BPH (benign prostatic hypertrophy) 03/08/2011  .  History of Zenker's diverticulum removal 03/08/2011  . Degenerative arthritis of lumbar spine 03/08/2011  . Other abnormal blood chemistry 05/25/2012  . Personal history of fall 05/25/2012  . Nocturia 03/30/2012  . Neoplasm of uncertain behavior of skin 11/11/2011  . Unspecified essential hypertension  08/05/2011  . Shortness of breath 08/05/2011  . Unspecified urinary incontinence 08/05/2011  . Hypoxemia 08/05/2011  . Blepharochalasis 05/15/2011  . Seborrheic keratosis 02/18/2011  . Pain in joint, lower leg 02/18/2011  . Myelodysplastic syndrome, unspecified 06/11/2010   Past Surgical History  Procedure Laterality Date  . Tonsillectomy    . Cholecystectomy    . Repair zenker's diverticula  1980's    Dr. Lennice Sites   Social History:   reports that he has quit smoking. His smoking use included Pipe. He has never used smokeless tobacco. He reports that he does not drink alcohol or use illicit drugs.  Family History  Problem Relation Age of Onset  . Heart disease Father     Medications: Patient's Medications  New Prescriptions   No medications on file  Previous Medications   ACETAMINOPHEN (TYLENOL) 500 MG TABLET    Take 500 mg by mouth every 6 (six) hours as needed for pain.   CHOLECALCIFEROL (VITAMIN D) 1000 UNITS CAPSULE    Take 1,000 Units by mouth daily.     CYANOCOBALAMIN 1000 MCG TABLET    Take 100 mcg by mouth 2 (two) times a week.   DARBEPOETIN (ARANESP) 300 MCG/0.6ML SOLN INJECTION    Inject 300 mcg into the skin every 21 ( twenty-one) days.   DEXTROMETHORPHAN (DELSYM) 30 MG/5ML LIQUID    Take 10 mL (60 mg total) by mouth as needed for cough.   DOCUSATE SODIUM (COLACE) 100 MG CAPSULE    Take 100 mg by mouth 2 (two) times daily. Take one daily   DOXAZOSIN (CARDURA) 2 MG TABLET    Take 2 mg by mouth daily.    FINASTERIDE (PROSCAR) 5 MG TABLET    Take 5 mg by mouth daily.   FOLIC ACID (FOLVITE) 1 MG TABLET    Take 1 tablet (1 mg total) by mouth daily.   HYDROCORTISONE (ANUSOL-HC) 2.5 % RECTAL CREAM    Place rectally 2 (two) times daily.   LEVOTHYROXINE (SYNTHROID, LEVOTHROID) 100 MCG TABLET    Take 100 mcg by mouth daily before breakfast.   NAPROXEN SODIUM (ANAPROX) 220 MG TABLET    Take 440 mg by mouth 2 (two) times daily as needed (for pain).    PROAIR HFA 108 (90 BASE) MCG/ACT  INHALER    Inhale 2 puffs into the lungs every 4 (four) hours as needed for wheezing or shortness of breath.    WITCH HAZEL-GLYCERIN (TUCKS) PAD    Place 1 application rectally as needed for pain.  Modified Medications   No medications on file  Discontinued Medications   LISINOPRIL (PRINIVIL,ZESTRIL) 5 MG TABLET    Take 1 tablet (5 mg total) by mouth daily.     Physical Exam: Physical Exam  Constitutional: He is oriented to person, place, and time. He appears well-developed. No distress.  HENT:  Head: Normocephalic and atraumatic.  Right Ear: External ear normal.  Left Ear: External ear normal.  Nose: Nose normal.  Mouth/Throat: No oropharyngeal exudate.  Eyes: Conjunctivae and EOM are normal. Pupils are equal, round, and reactive to light. Right eye exhibits no discharge. Left eye exhibits no discharge. No scleral icterus.  Neck: Normal range of motion. Neck supple. No JVD present. No tracheal  deviation present. No thyromegaly present.  Cardiovascular: Normal rate and regular rhythm.   Murmur heard.  Systolic murmur is present with a grade of 2/6  Pulmonary/Chest: Effort normal. No stridor. No respiratory distress. He has no wheezes. He has rales in the right middle field, the right lower field, the left middle field and the left lower field. He exhibits no tenderness.  Abdominal: Soft. Bowel sounds are normal. He exhibits no distension. There is no tenderness. There is no rebound.  Musculoskeletal: Normal range of motion. He exhibits no edema and no tenderness.  Lymphadenopathy:    He has no cervical adenopathy.  Neurological: He is alert and oriented to person, place, and time. He has normal reflexes. He displays normal reflexes. No cranial nerve deficit. He exhibits normal muscle tone. Coordination normal.  Skin: Skin is warm and dry. No rash noted. He is not diaphoretic. No erythema. There is pallor.  Psychiatric: He has a normal mood and affect. His behavior is normal. Judgment  and thought content normal.    Filed Vitals:   11/13/12 1406  BP: 88/36  Pulse: 72  Temp: 97 F (36.1 C)  TempSrc: Tympanic  Resp: 20      Labs reviewed: Basic Metabolic Panel:  Recent Labs  69/62/95 0200 09/06/12 0548 09/07/12 1220 11/06/12 1106 11/06/12 1630 11/06/12 1631  NA 135 137 134* 139  --   --   K 4.7 5.3* 4.3 4.3  --   --   CL 103 104 100 106  --   --   CO2 26 27 28 29   --   --   GLUCOSE 129* 150* 85 103*  --   --   BUN 33* 43* 64* 40*  --   --   CREATININE 1.18 1.23 1.72* 1.16 1.05  --   CALCIUM 9.3 9.3 8.8 9.0  --   --   TSH 0.501  --   --   --   --  0.853   Liver Function Tests:  Recent Labs  09/05/12 0200  AST 17  ALT 12  ALKPHOS 72  BILITOT 0.6  PROT 6.9  ALBUMIN 3.0*   CBC:  Recent Labs  09/18/12 1323 10/09/12 1348 10/30/12 1331 11/06/12 1106 11/06/12 1630  WBC 4.3 2.2* 2.6* 2.4* 2.2*  NEUTROABS 3.4 1.1* 1.5  --   --   HGB 8.5* 9.1* 9.5* 8.8* 8.5*  HCT 25.8* 27.9* 30.8* 28.6* 27.8*  MCV 96.1 97.1 101.0* 101.8* 102.2*  PLT 71* 72* 49* 58* 55*   Past Procedures: 09/05/12 CT chest:   IMPRESSION:  Moderate chronic interstitial lung disease, as described above,  possibly reflecting a UIP pattern.  No superimposed opacities suspicious for pneumonia.  Possible traheobronchomegaly.  09/05/12 Echocardiogram EF 25-30%.  11/06/12 CXR IMPRESSION:  Chronic lung changes appear similar to the prior examination. This  limits for evaluating for underlying mass or infiltrate or  pulmonary edema.  Cardiomegaly.  Calcified tortuous aorta.      Assessment/Plan Pulmonary HTN O2 desaturation--O2 2lpm [place]  Hypotension, unspecified Bp 98/54, 78/45, 99/69, 94/50, 60/38, 98/48--dc Lisinopril and Bp and orthostatic BP  Myelodysplastic syndrome, unspecified Lower WBC 3.0, Hgb 7.6, takes B12 and folic acid, will repeat CBC and obtain serum Iron.   Hypothyroidism Takes Levothyroxine , update TSH and obtain MMSE  BPH (benign  prostatic hypertrophy) Stable on Finasteride 5mg  and Doxazosin 2mg .   Insomnia Yelling for help at night frequently--add Mirtazapine 7.5mg  daily.   Postinflammatory pulmonary fibrosis Chronic dyspnea, O2 dependent, rales posterior  mid/lower lungs.   Combined congestive systolic and diastolic heart failure Dyspnea, O2 desaturation RA-O2 dependent.   Hypoxia CHF, pulmonary fibrosis, anemia with Hgb 7s, lower Bps all contributed to his O2 desaturation.     Family/ Staff Communication: observe the patient  Goals of Care: SNF  Labs/tests ordered: MMSE, CBC, Iron.

## 2012-11-13 NOTE — Assessment & Plan Note (Signed)
Takes Levothyroxine , update TSH and obtain MMSE

## 2012-11-13 NOTE — Assessment & Plan Note (Signed)
Chronic dyspnea, O2 dependent, rales posterior mid/lower lungs.

## 2012-11-13 NOTE — Assessment & Plan Note (Signed)
CHF, pulmonary fibrosis, anemia with Hgb 7s, lower Bps all contributed to his O2 desaturation.

## 2012-11-13 NOTE — Assessment & Plan Note (Signed)
Lower WBC 3.0, Hgb 7.6, takes B12 and folic acid, will repeat CBC and obtain serum Iron.

## 2012-11-13 NOTE — Assessment & Plan Note (Signed)
Yelling for help at night frequently--add Mirtazapine 7.5mg  daily.

## 2012-11-13 NOTE — Assessment & Plan Note (Signed)
Dyspnea, O2 desaturation RA-O2 dependent.

## 2012-11-13 NOTE — Assessment & Plan Note (Signed)
Stable on Finasteride 5mg  and Doxazosin 2mg .

## 2012-11-13 NOTE — Assessment & Plan Note (Addendum)
O2 desaturation--O2 2lpm [place]

## 2012-11-20 DEATH — deceased

## 2012-11-21 ENCOUNTER — Ambulatory Visit: Payer: Medicare Other

## 2012-11-21 ENCOUNTER — Other Ambulatory Visit: Payer: Medicare Other | Admitting: Lab

## 2012-12-11 ENCOUNTER — Ambulatory Visit: Payer: Medicare Other

## 2012-12-11 ENCOUNTER — Other Ambulatory Visit: Payer: Medicare Other | Admitting: Lab

## 2012-12-12 ENCOUNTER — Other Ambulatory Visit: Payer: Medicare Other | Admitting: Lab

## 2012-12-12 ENCOUNTER — Ambulatory Visit: Payer: Medicare Other

## 2013-01-01 ENCOUNTER — Ambulatory Visit: Payer: Medicare Other

## 2013-01-01 ENCOUNTER — Ambulatory Visit: Payer: Medicare Other | Admitting: Oncology

## 2013-01-01 ENCOUNTER — Other Ambulatory Visit: Payer: Medicare Other | Admitting: Lab

## 2013-01-02 ENCOUNTER — Other Ambulatory Visit: Payer: Medicare Other | Admitting: Lab

## 2013-01-02 ENCOUNTER — Ambulatory Visit: Payer: Medicare Other

## 2013-01-04 ENCOUNTER — Encounter: Payer: Self-pay | Admitting: Internal Medicine

## 2013-01-05 NOTE — Progress Notes (Signed)
Patient is deceased.

## 2013-11-16 ENCOUNTER — Other Ambulatory Visit: Payer: Self-pay | Admitting: Pharmacist

## 2014-10-22 IMAGING — CT CT CHEST W/O CM
2 of 4 series · 15 of 36 positions shown, 18 images · non-contrast
Comparison: Chest radiographs dated 08/25/2012

CLINICAL DATA: Chronic lung disease, weakness, cough, shortness of
breath

CT CHEST WITHOUT CONTRAST
TECHNIQUE: Multidetector CT imaging of the chest was performed
following the standard protocol without IV contrast.

[Series 2: chest w/o st · axial · non-contrast · 0.74mm/px · z∈[-281,-51]mm · 12 of 56 slices shown, 15 images]
[im 5/56  mediastinal]
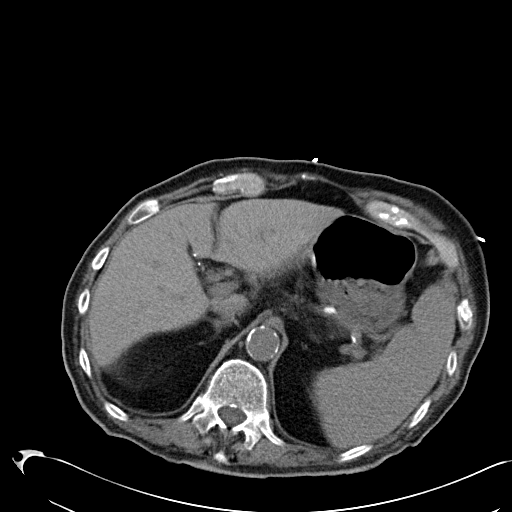
[im 5/56  lung]
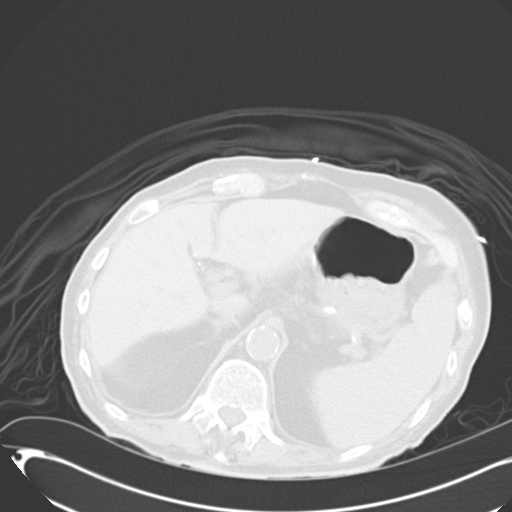
[im 9/56  lung]
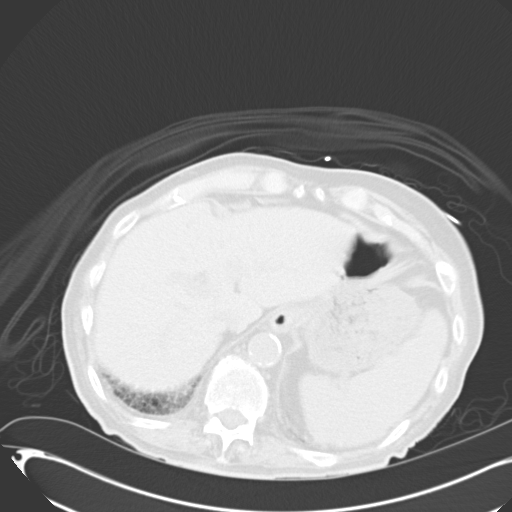
[im 13/56  lung]
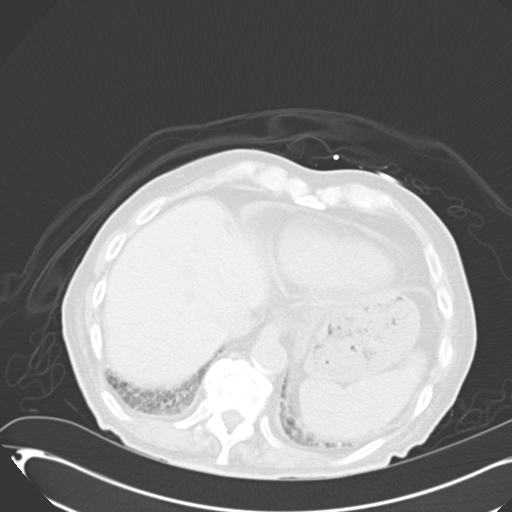
[im 17/56  lung]
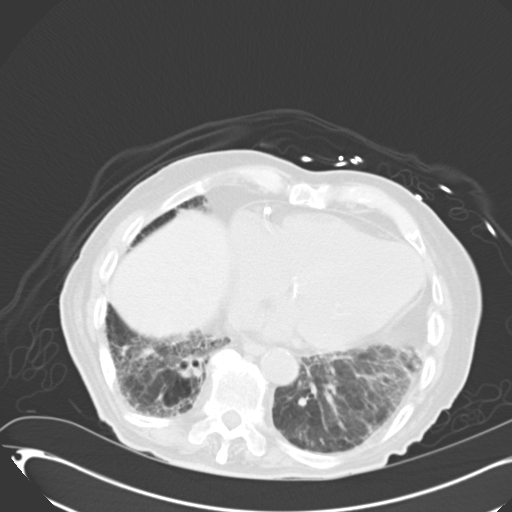
[im 22/56  mediastinal]
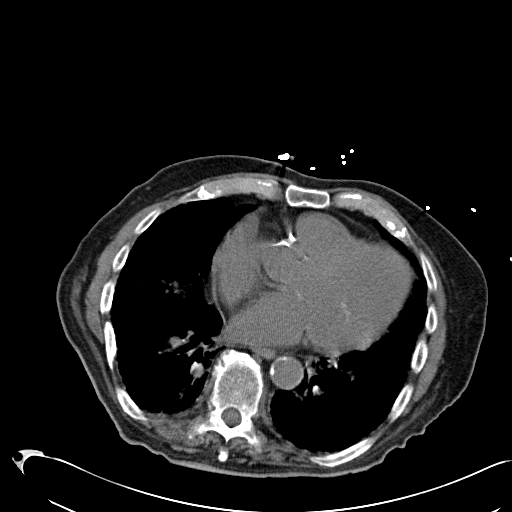
[im 22/56  lung]
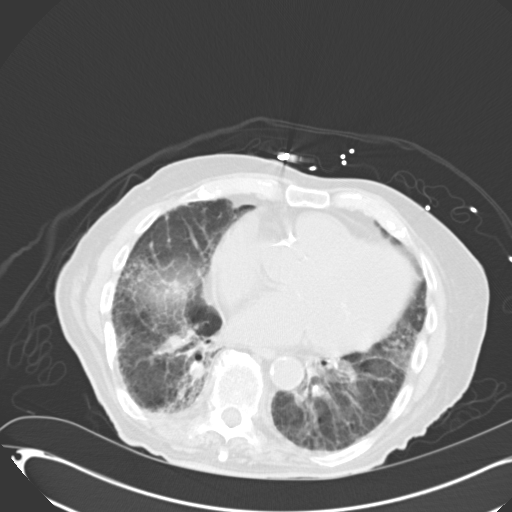
[im 26/56  lung]
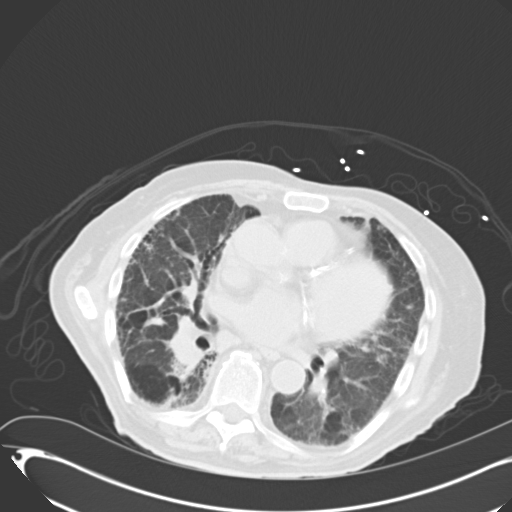
[im 30/56  lung]
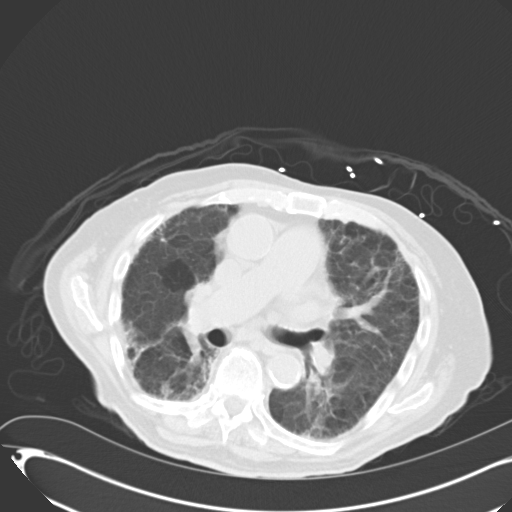
[im 34/56  lung]
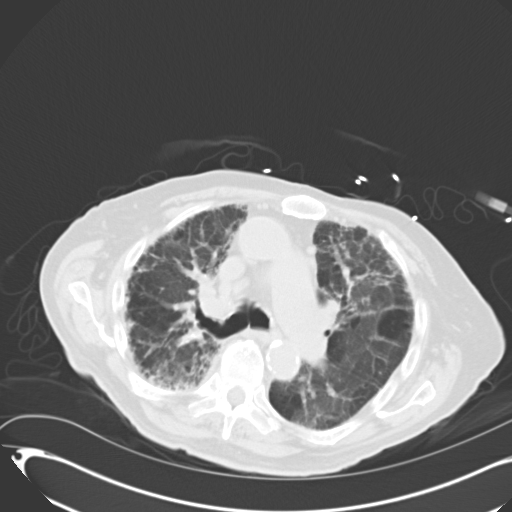
[im 39/56  mediastinal]
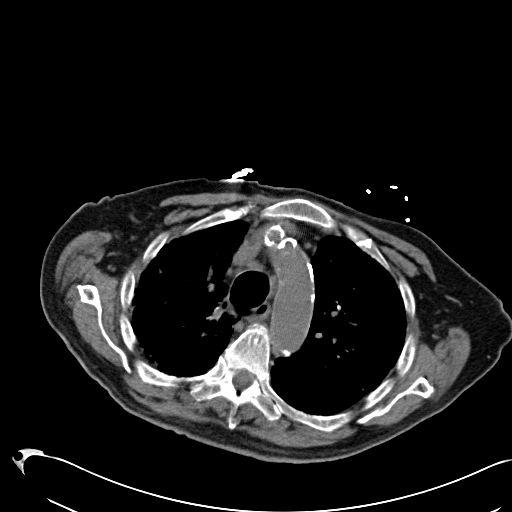
[im 39/56  lung]
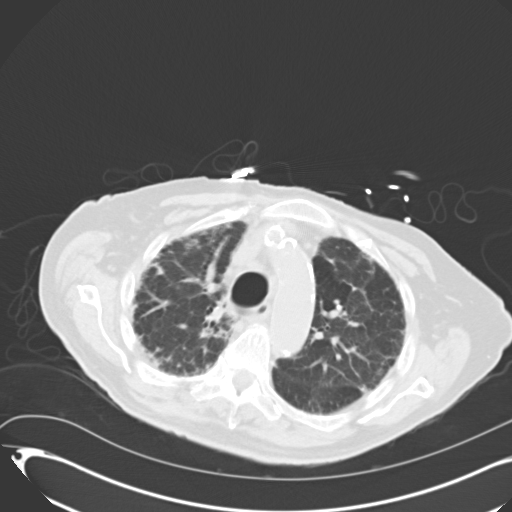
[im 43/56  lung]
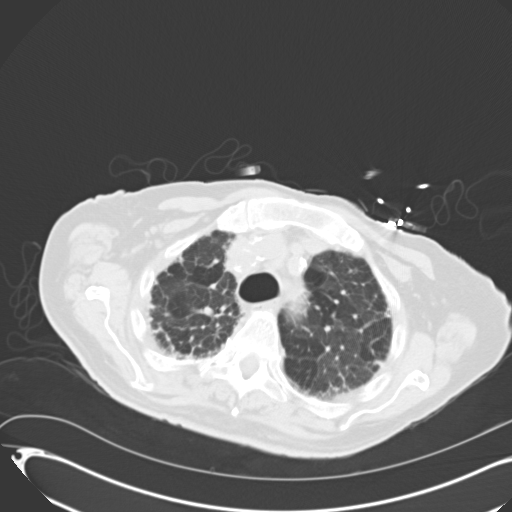
[im 47/56  lung]
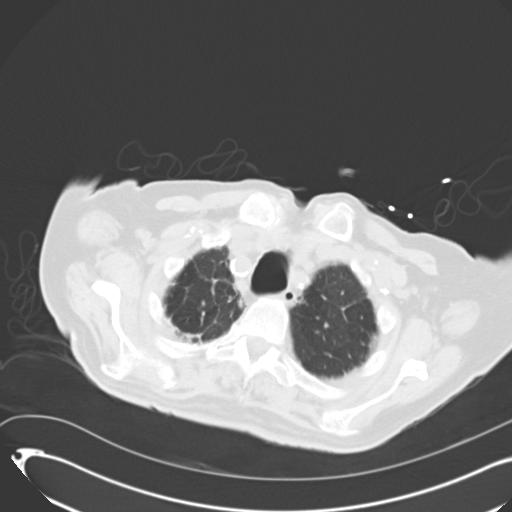
[im 51/56  lung]
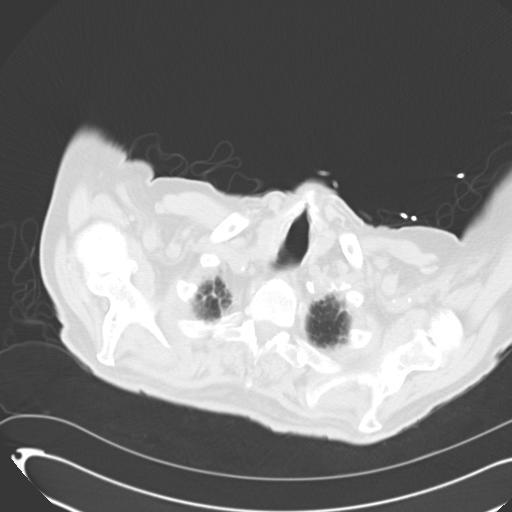

[Series 602: <mpr thick range> · coronal · 0.74mm/px · 3 of 77 slices shown]
[im 16/77  lung]
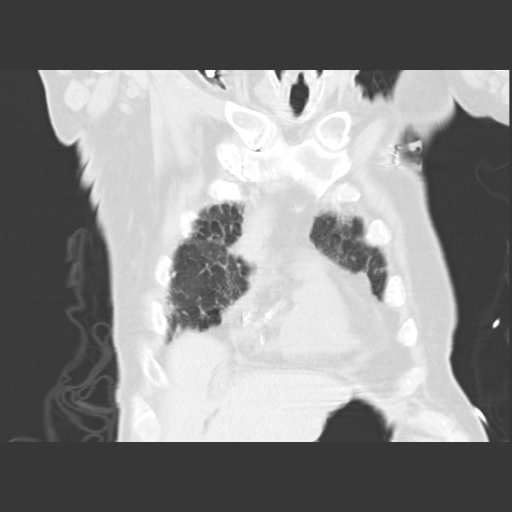
[im 31/77  lung]
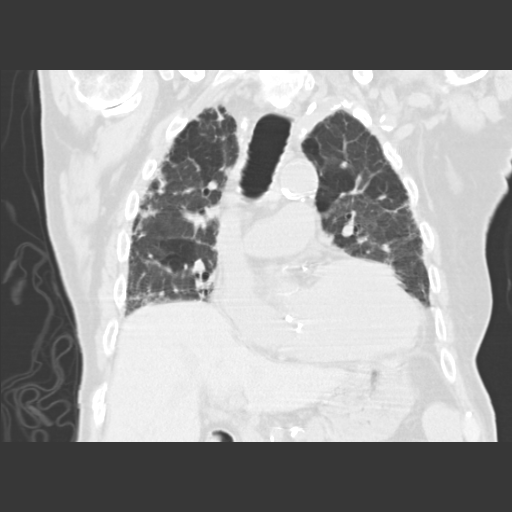
[im 46/77  lung]
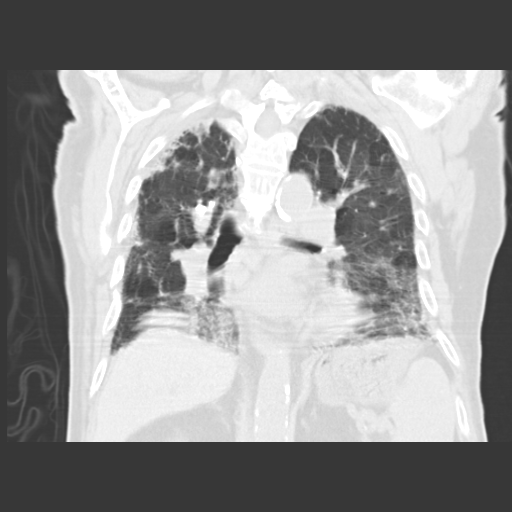

[15 of 36 positions shown; findings below may reference images not displayed]

FINDINGS: Evaluation is constrained by respiratory motion.

Subpleural reticulation/fibrosis with possible honeycombing in the
right lower lung, compatible with moderate chronic interstitial
lung disease, possibly reflecting a UIP pattern.

No superimposed opacities suspicious for pneumonia. No suspicious
pulmonary nodules. No pleural effusion or pneumothorax.

Possible tracheobronchomegaly.

Cardiomegaly.  No pericardial effusion.  Coronary atherosclerosis.
Atherosclerotic calcifications of the aortic arch.

Small mediastinal lymph nodes which do not meet pathologic CT size
criteria, likely reactive.  No suspicious axillary lymphadenopathy.

Visualized upper abdomen is notable for vascular calcifications and
cholecystectomy clips.

Degenerative changes of the visualized thoracolumbar spine.
IMPRESSION: Moderate chronic interstitial lung disease, as described above,
possibly reflecting a UIP pattern.

No superimposed opacities suspicious for pneumonia.

Possible tracheobronchomegaly.

## 2014-10-24 IMAGING — CR DG CHEST 2V
2 series · 2 of 2 positions shown · non-contrast
Comparison: CT chest dated 09/05/2012

CLINICAL DATA: Chest pain

CHEST - 2 VIEW

[w chest lat]
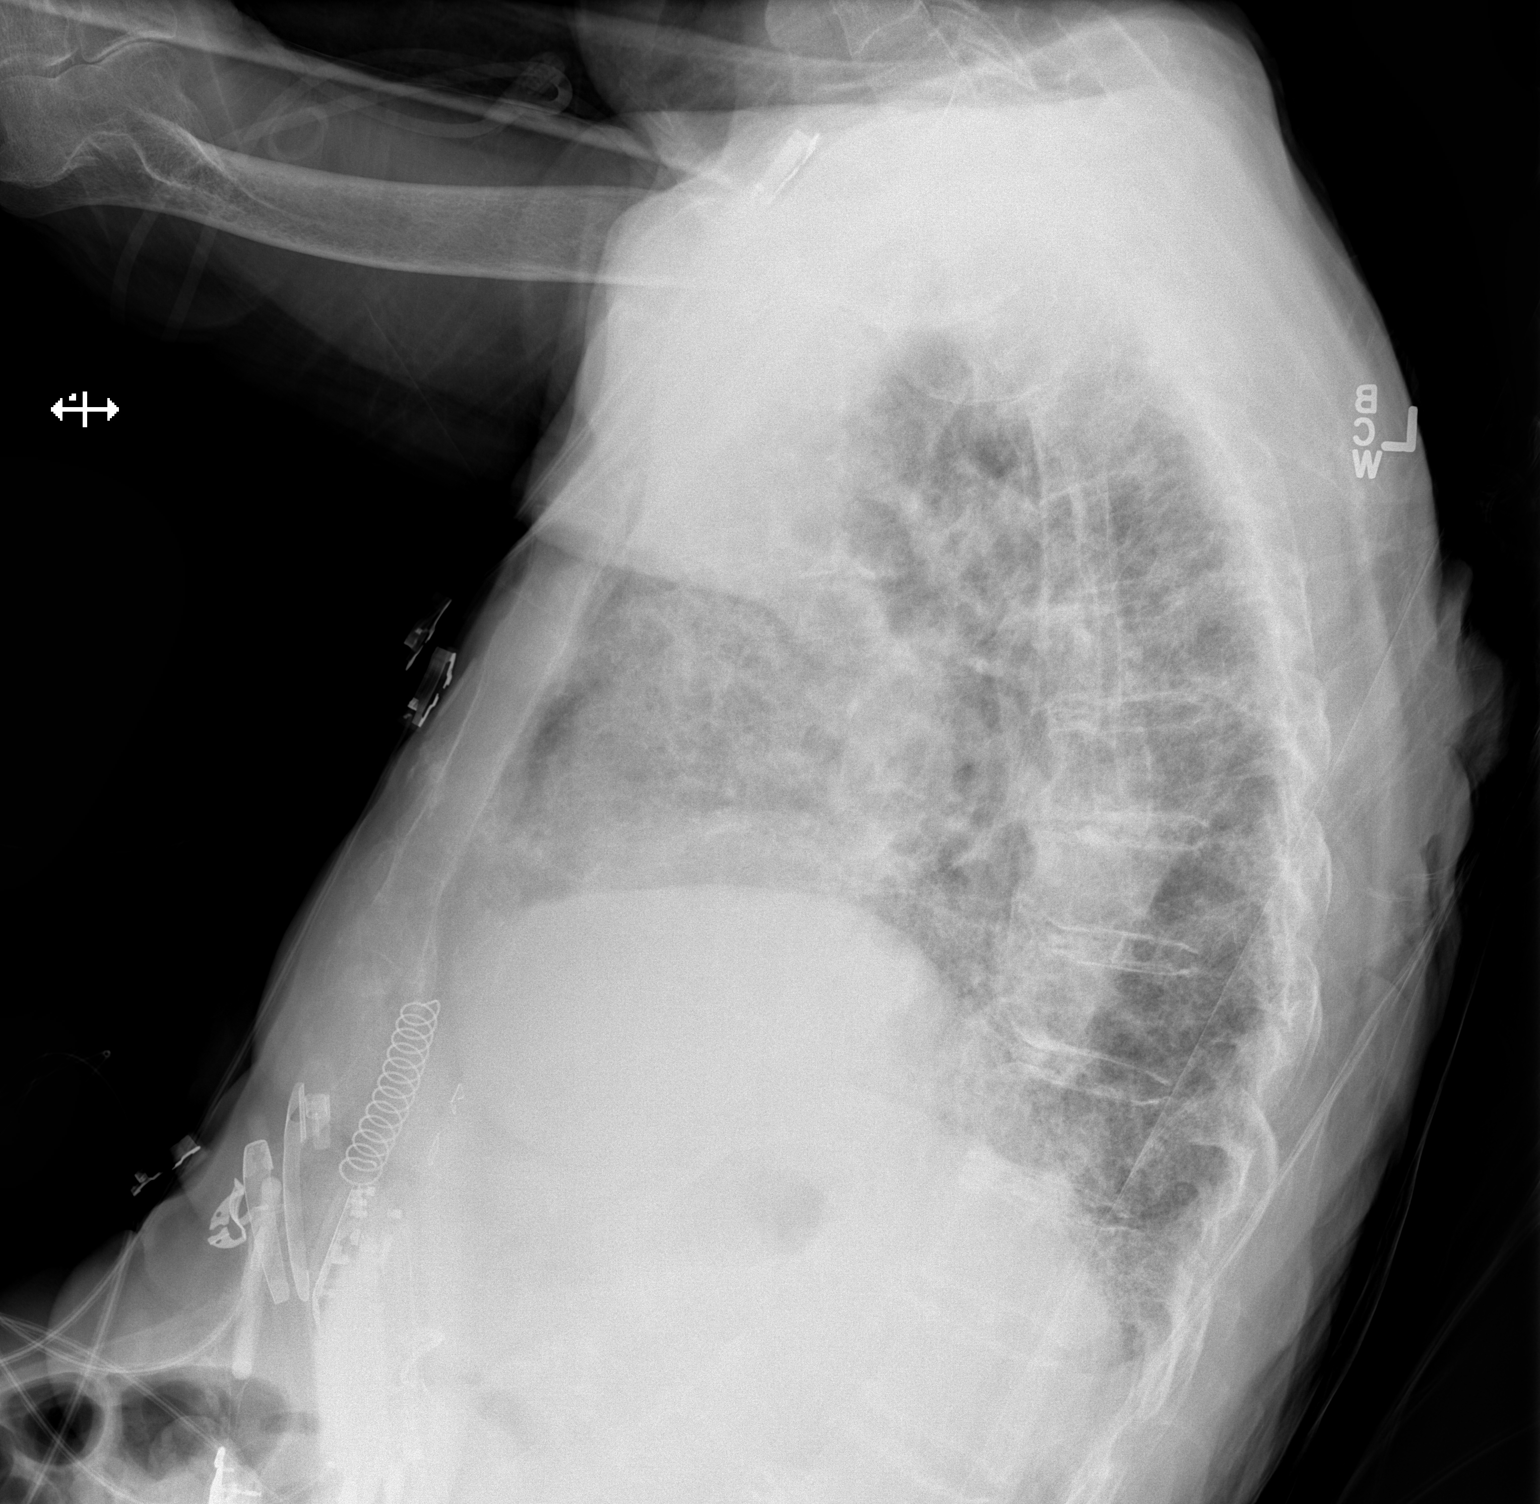

[x chest ap]
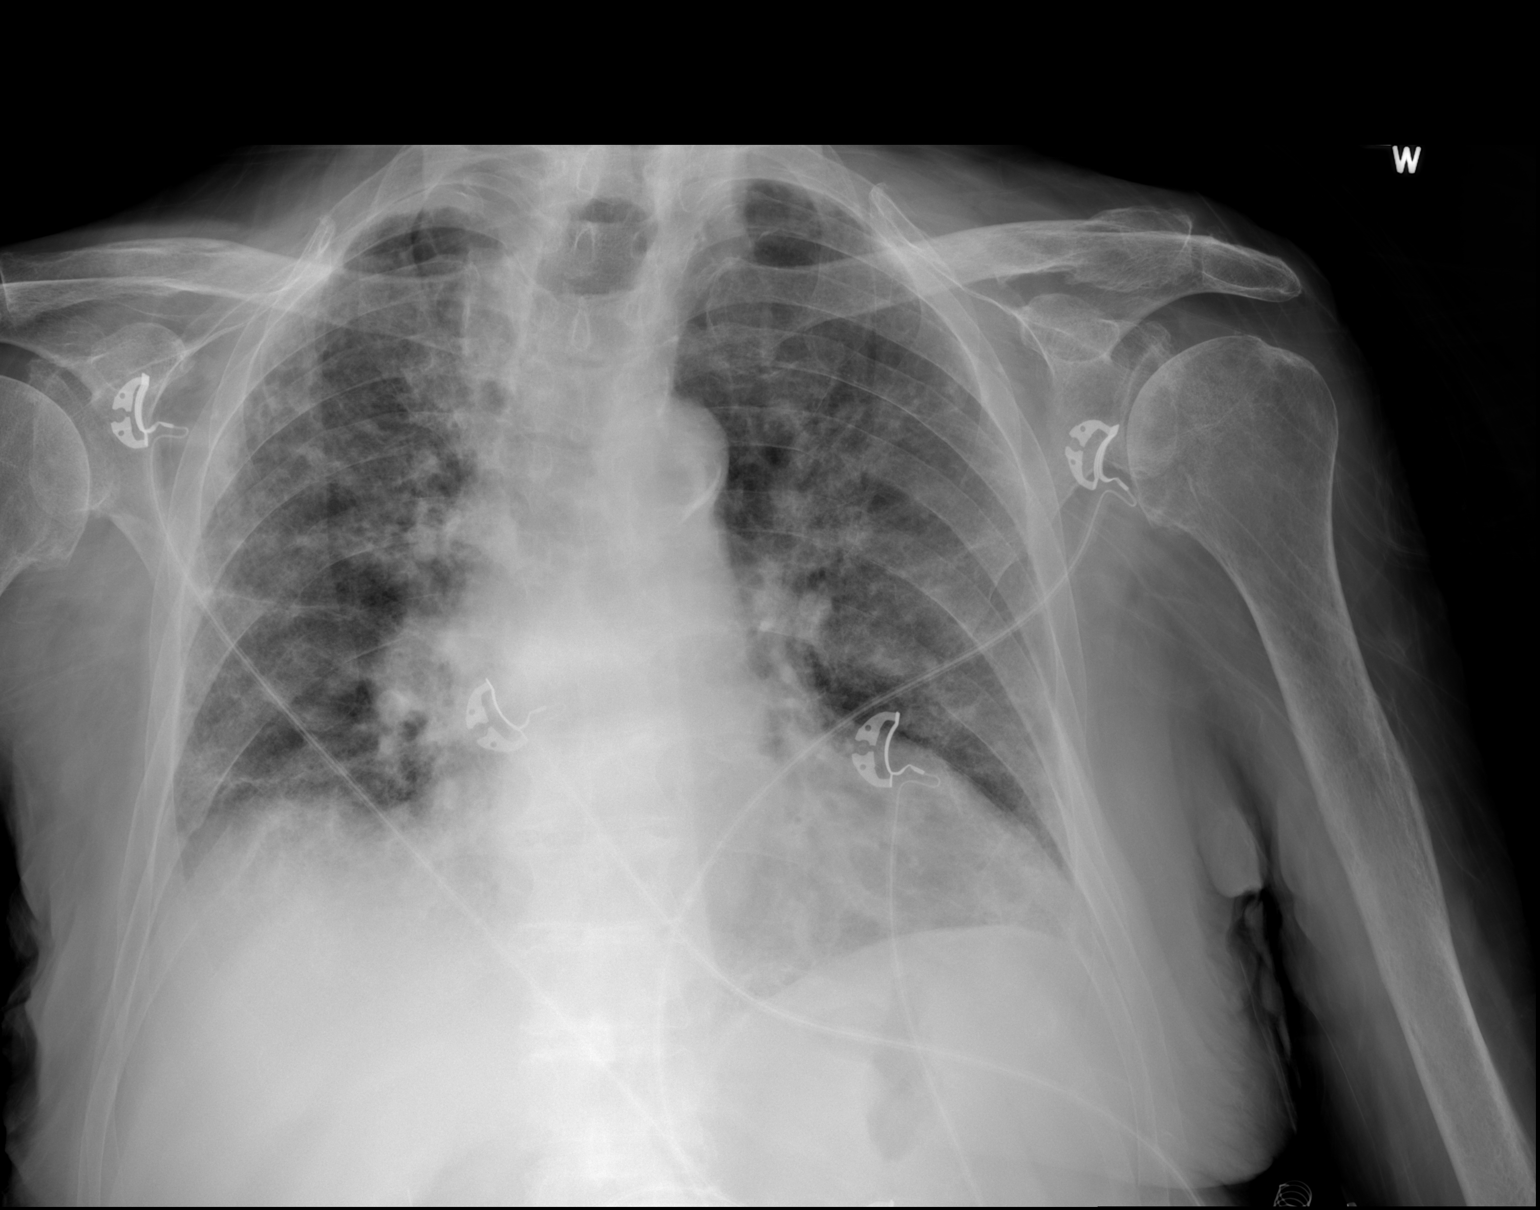

[2 of 2 positions shown; findings below may reference images not displayed]

FINDINGS: Chronic interstitial lung disease, better evaluated on
recent CT.  No superimposed opacities suspicious for pneumonia.  No
pleural effusion or pneumothorax.

Cardiomegaly.

Degenerative changes of the visualized thoracolumbar spine.

Deformity of the distal/lateral left clavicle.
IMPRESSION: Chronic interstitial lung disease, better evaluated on recent CT.

No superimposed opacities suspicious for pneumonia.

## 2015-01-30 NOTE — Progress Notes (Signed)
This encounter was created in error - please disregard.
